# Patient Record
Sex: Female | Born: 1965 | Race: White | Hispanic: No | Marital: Married | Smoking: Never smoker
Health system: Southern US, Community
[De-identification: ages and names within clinical notes are randomized; demographics above are authoritative.]

## PROBLEM LIST (undated history)

## (undated) DIAGNOSIS — Z8619 Personal history of other infectious and parasitic diseases: Secondary | ICD-10-CM

## (undated) DIAGNOSIS — K5900 Constipation, unspecified: Secondary | ICD-10-CM

## (undated) DIAGNOSIS — T7840XA Allergy, unspecified, initial encounter: Secondary | ICD-10-CM

## (undated) DIAGNOSIS — I10 Essential (primary) hypertension: Secondary | ICD-10-CM

## (undated) DIAGNOSIS — N882 Stricture and stenosis of cervix uteri: Secondary | ICD-10-CM

## (undated) DIAGNOSIS — K219 Gastro-esophageal reflux disease without esophagitis: Secondary | ICD-10-CM

## (undated) DIAGNOSIS — N857 Hematometra: Secondary | ICD-10-CM

## (undated) DIAGNOSIS — Z8679 Personal history of other diseases of the circulatory system: Secondary | ICD-10-CM

## (undated) DIAGNOSIS — Z8489 Family history of other specified conditions: Secondary | ICD-10-CM

## (undated) DIAGNOSIS — Z973 Presence of spectacles and contact lenses: Secondary | ICD-10-CM

## (undated) HISTORY — DX: Gastro-esophageal reflux disease without esophagitis: K21.9

## (undated) HISTORY — PX: TOE SURGERY: SHX1073

## (undated) HISTORY — DX: Allergy, unspecified, initial encounter: T78.40XA

## (undated) HISTORY — PX: LASIK: SHX215

## (undated) HISTORY — DX: Essential (primary) hypertension: I10

## (undated) HISTORY — PX: OTHER SURGICAL HISTORY: SHX169

---

## 2005-02-17 ENCOUNTER — Inpatient Hospital Stay (HOSPITAL_COMMUNITY): Admission: RE | Admit: 2005-02-17 | Discharge: 2005-02-20 | Payer: Self-pay | Admitting: Obstetrics and Gynecology

## 2005-03-30 ENCOUNTER — Other Ambulatory Visit: Admission: RE | Admit: 2005-03-30 | Discharge: 2005-03-30 | Payer: Self-pay | Admitting: Obstetrics and Gynecology

## 2006-08-21 ENCOUNTER — Ambulatory Visit: Payer: Self-pay | Admitting: Family Medicine

## 2007-04-16 ENCOUNTER — Ambulatory Visit: Payer: Self-pay | Admitting: Family Medicine

## 2007-04-16 DIAGNOSIS — J309 Allergic rhinitis, unspecified: Secondary | ICD-10-CM | POA: Insufficient documentation

## 2007-04-16 DIAGNOSIS — I10 Essential (primary) hypertension: Secondary | ICD-10-CM | POA: Insufficient documentation

## 2007-04-16 DIAGNOSIS — K219 Gastro-esophageal reflux disease without esophagitis: Secondary | ICD-10-CM

## 2008-02-04 ENCOUNTER — Ambulatory Visit: Payer: Self-pay | Admitting: Family Medicine

## 2008-02-04 DIAGNOSIS — J019 Acute sinusitis, unspecified: Secondary | ICD-10-CM

## 2008-08-01 ENCOUNTER — Ambulatory Visit: Payer: Self-pay | Admitting: Family Medicine

## 2008-08-11 ENCOUNTER — Encounter: Admission: RE | Admit: 2008-08-11 | Discharge: 2008-08-11 | Payer: Self-pay | Admitting: Obstetrics and Gynecology

## 2008-09-01 ENCOUNTER — Ambulatory Visit: Payer: Self-pay | Admitting: Family Medicine

## 2008-09-04 ENCOUNTER — Encounter: Admission: RE | Admit: 2008-09-04 | Discharge: 2008-09-04 | Payer: Self-pay | Admitting: General Surgery

## 2009-06-04 ENCOUNTER — Ambulatory Visit: Payer: Self-pay | Admitting: Family Medicine

## 2009-06-04 DIAGNOSIS — H01139 Eczematous dermatitis of unspecified eye, unspecified eyelid: Secondary | ICD-10-CM | POA: Insufficient documentation

## 2009-09-29 ENCOUNTER — Ambulatory Visit: Payer: Self-pay | Admitting: Family Medicine

## 2009-12-15 HISTORY — PX: HYSTEROSCOPY W/ ENDOMETRIAL ABLATION: SUR665

## 2009-12-15 HISTORY — PX: DILATION AND CURETTAGE OF UTERUS: SHX78

## 2010-04-06 IMAGING — MG MM DIAGNOSTIC UNILATERAL L
3 series · 3 of 3 positions shown · non-contrast
Comparison: 06/08/2006

CLINICAL DATA: Abnormal left screening mammogram

DIGITAL DIAGNOSTIC  LEFT  MAMMOGRAM  ] AND LEFT BREAST ULTRASOUND:

[L CC (1 of 2)]
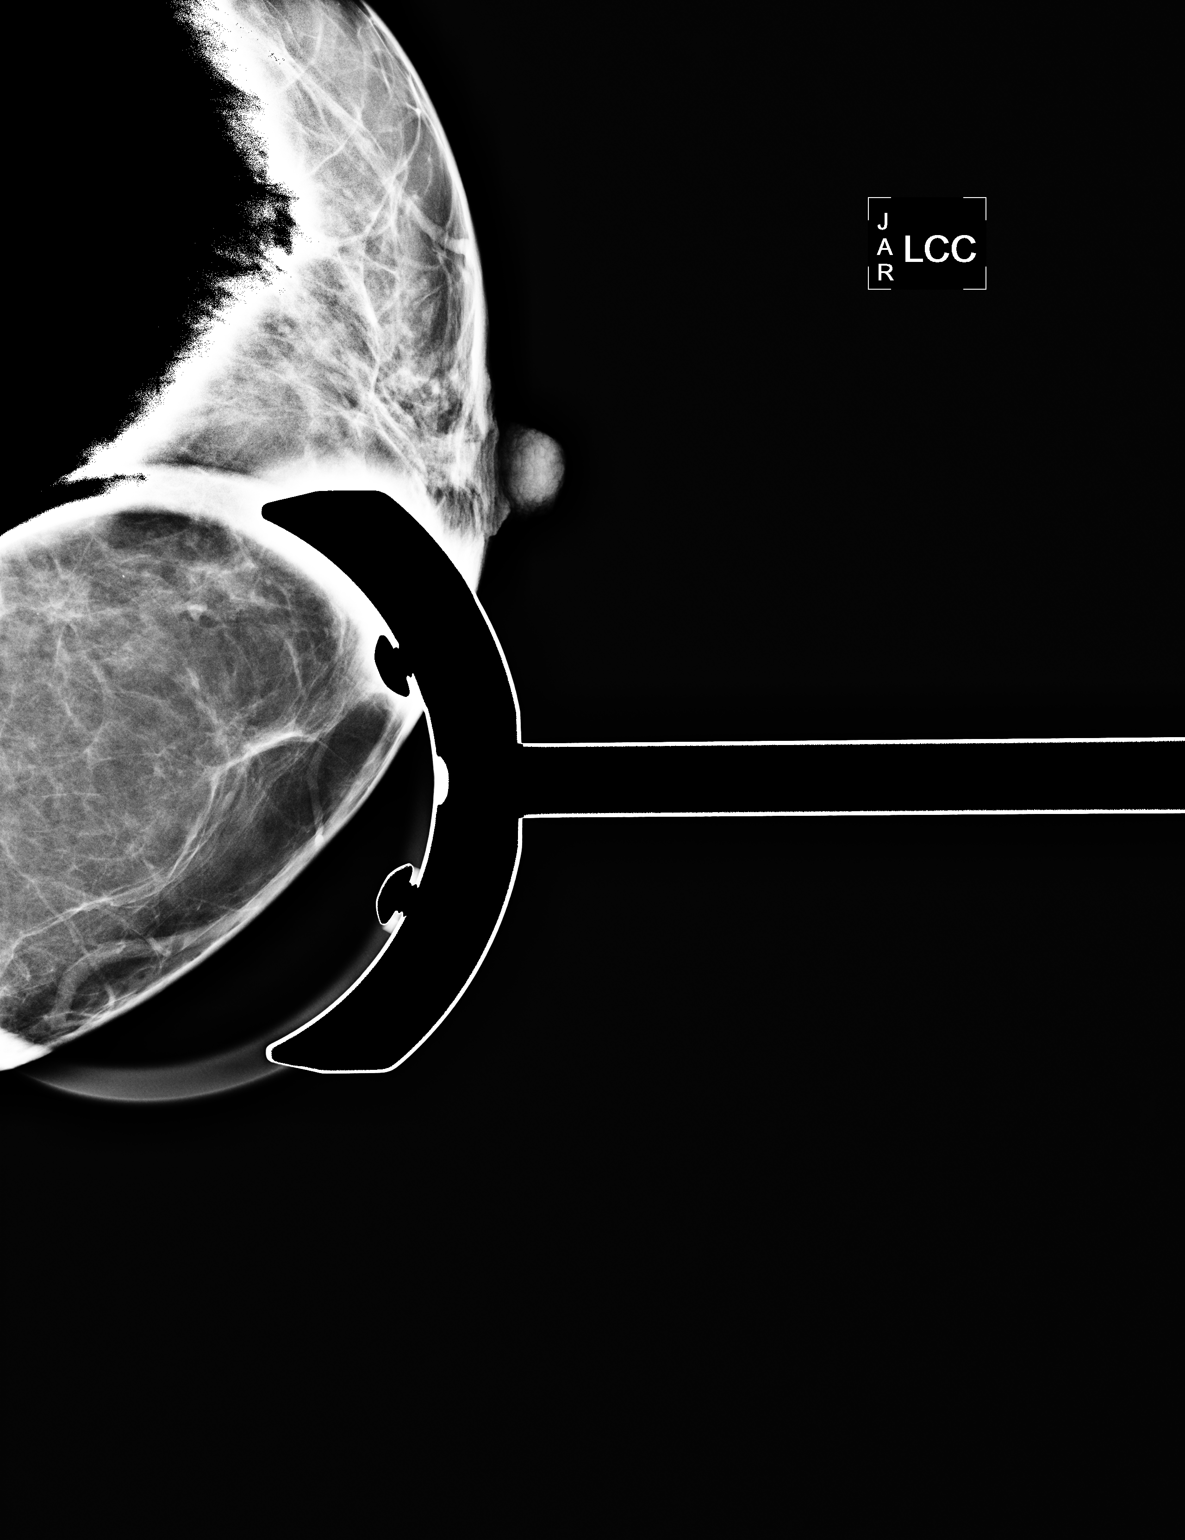

[L MLO]
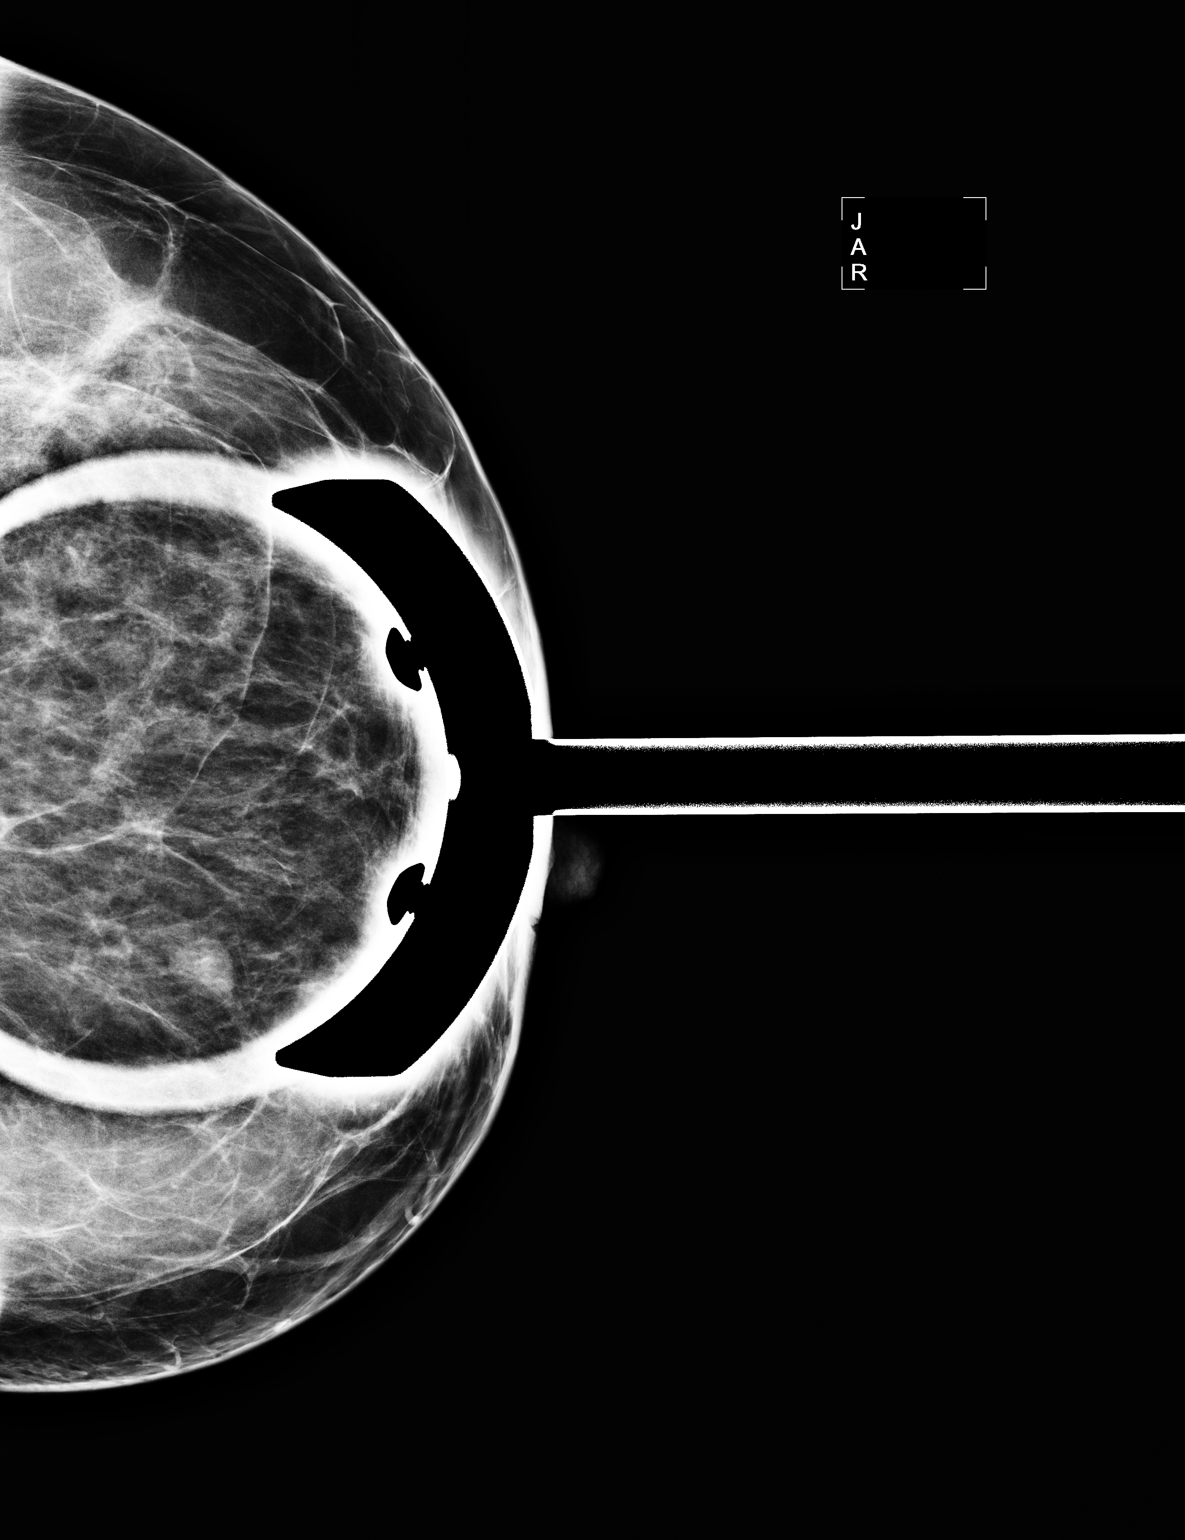

[L CC (2 of 2)]
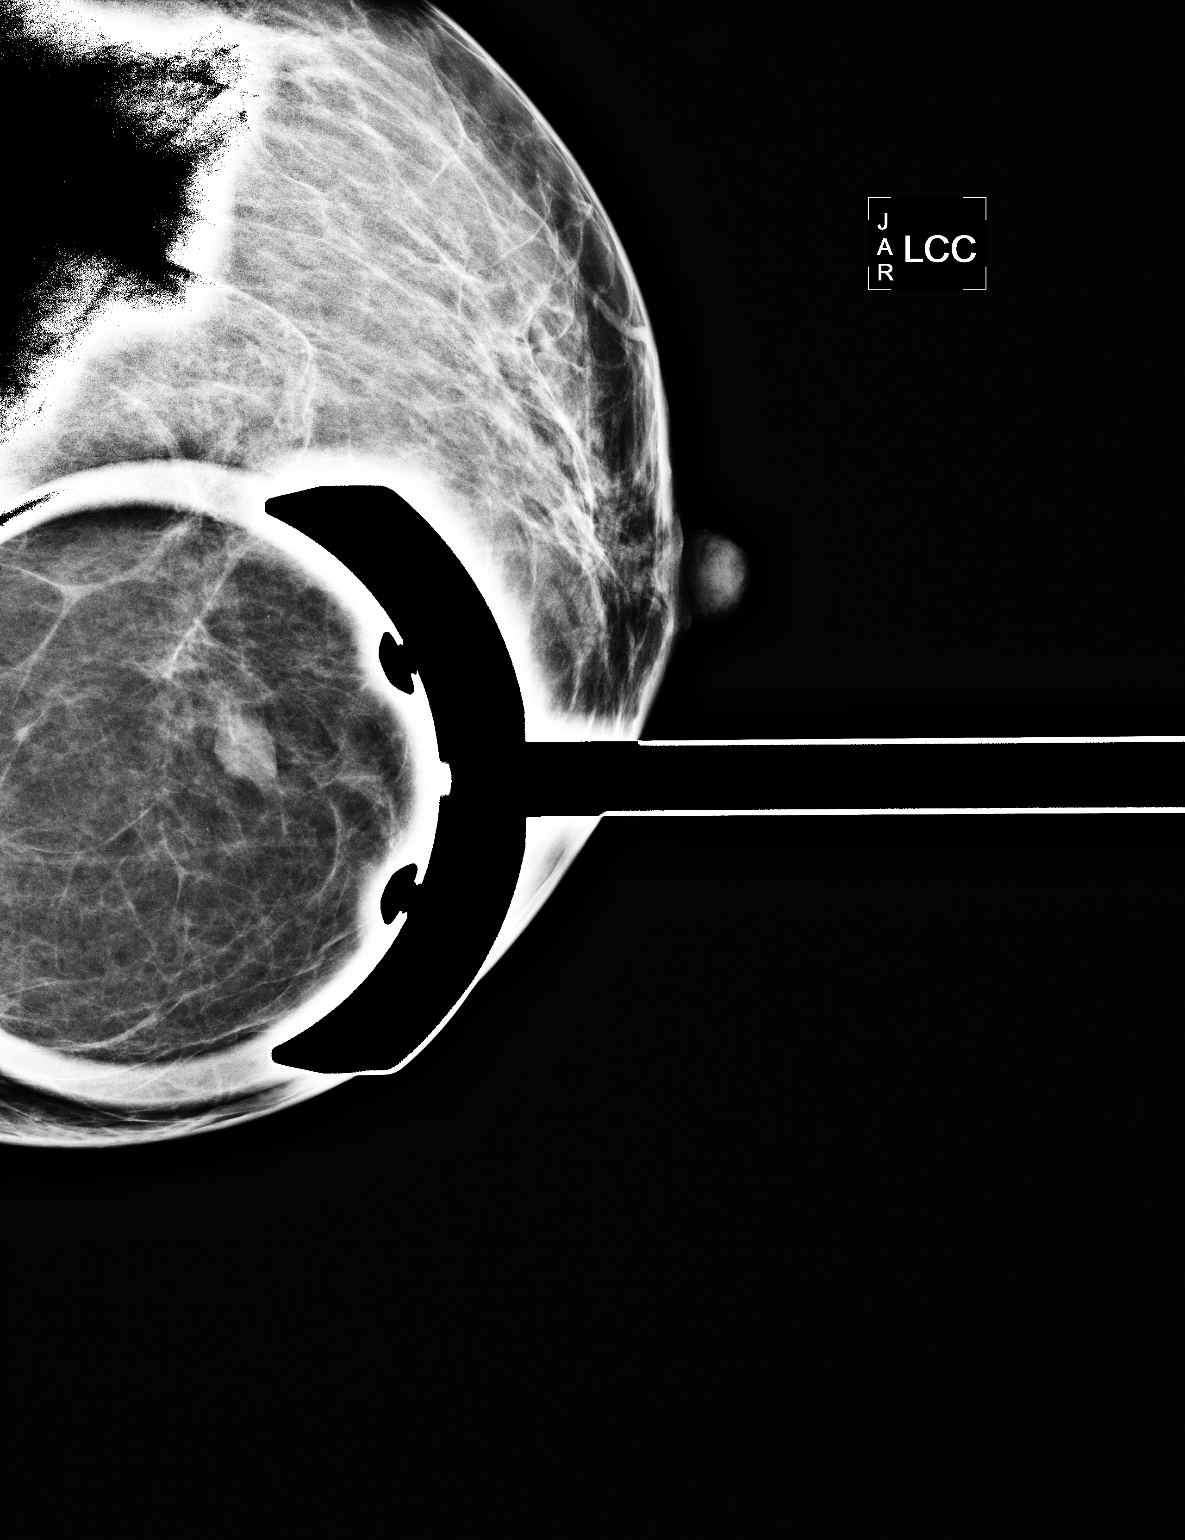

[3 of 3 positions shown; findings below may reference images not displayed]

FINDINGS: Spot compression views of the medial aspect of left
breast was performed.  There is persistence of a low density
nodule.  It has a stable appearance when compared to the 06/08/2006
study.  It is well circumscribed with no malignant-type
microcalcifications.

On physical exam, I do not palpate a mass in the left breast.

Ultrasound is performed, showing there is a hypoechoic well
circumscribed nodule with increased through transmission in the
left breast at 10 o'clock approximately 4 cm from the nipple
measuring 8 x 4 x 9 mm. This is consistent with a fibroadenoma
given its stability since the 06/07/2006 study.
IMPRESSION: Benign appearing mass, left breast.  Screening mammogram in 1 year
is recommended.

BI-RADS CATEGORY 2:  Benign finding(s).

## 2010-04-06 IMAGING — US UNKNOWN PR STUDY
1 series · 5 of 5 positions shown · non-contrast
Comparison: 06/08/2006

CLINICAL DATA: Abnormal left screening mammogram

DIGITAL DIAGNOSTIC  LEFT  MAMMOGRAM  ] AND LEFT BREAST ULTRASOUND:

[Series 1: unknown pr study · 5 of 5 slices shown]
[im 1/5]
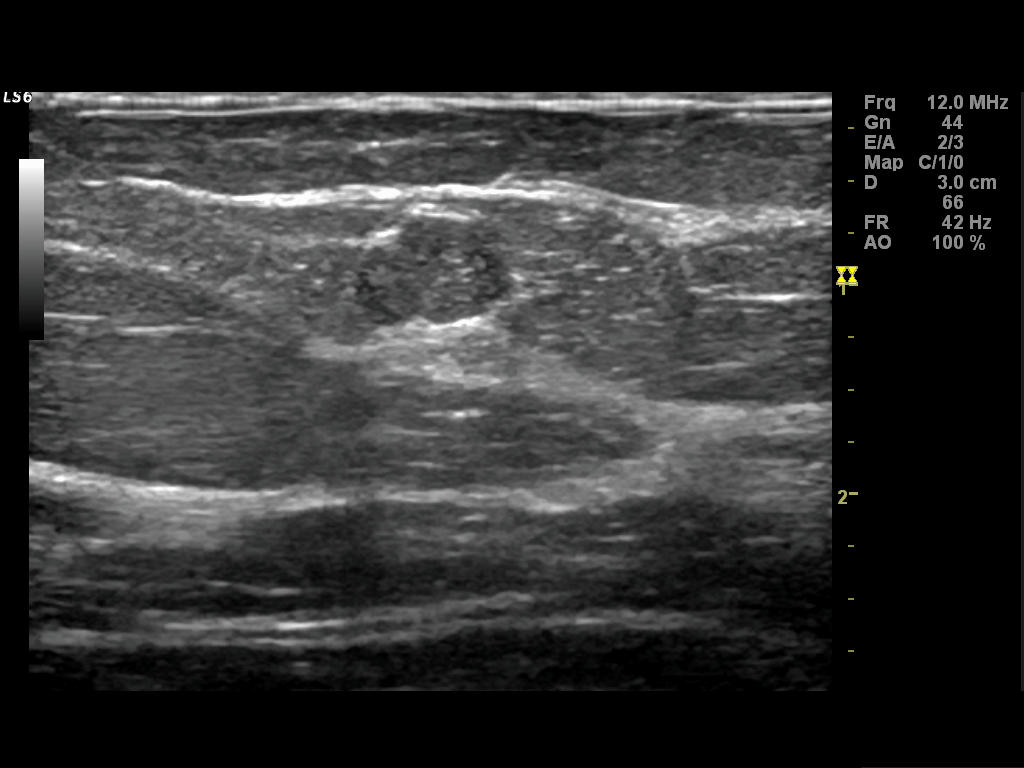
[im 2/5]
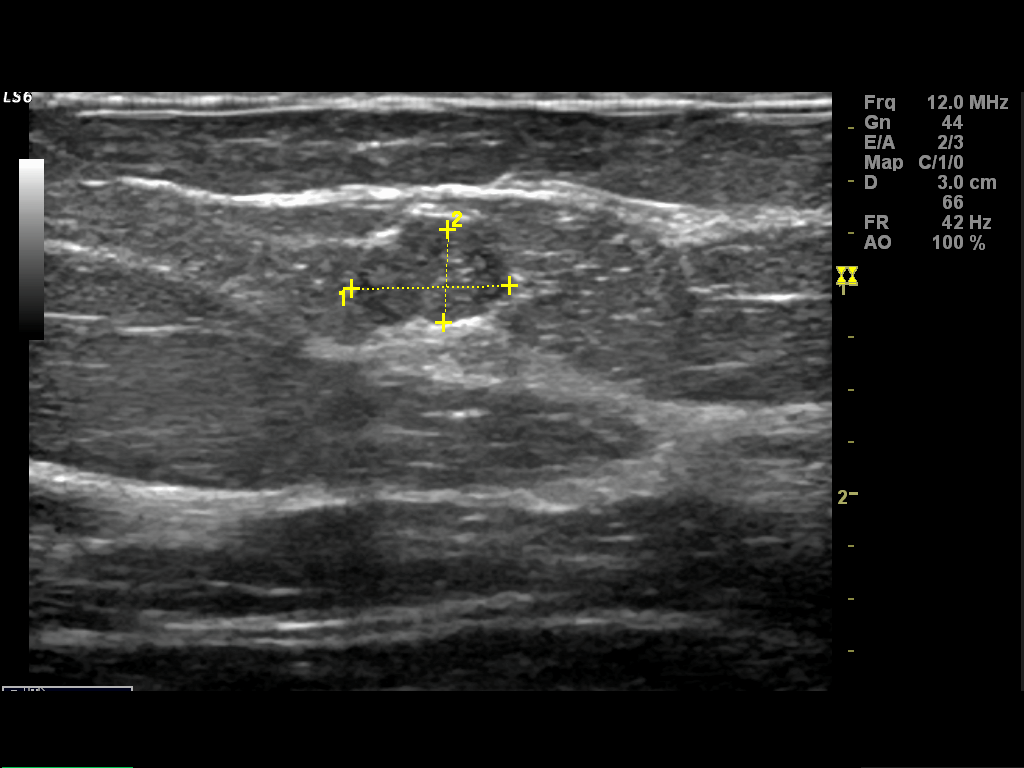
[im 3/5]
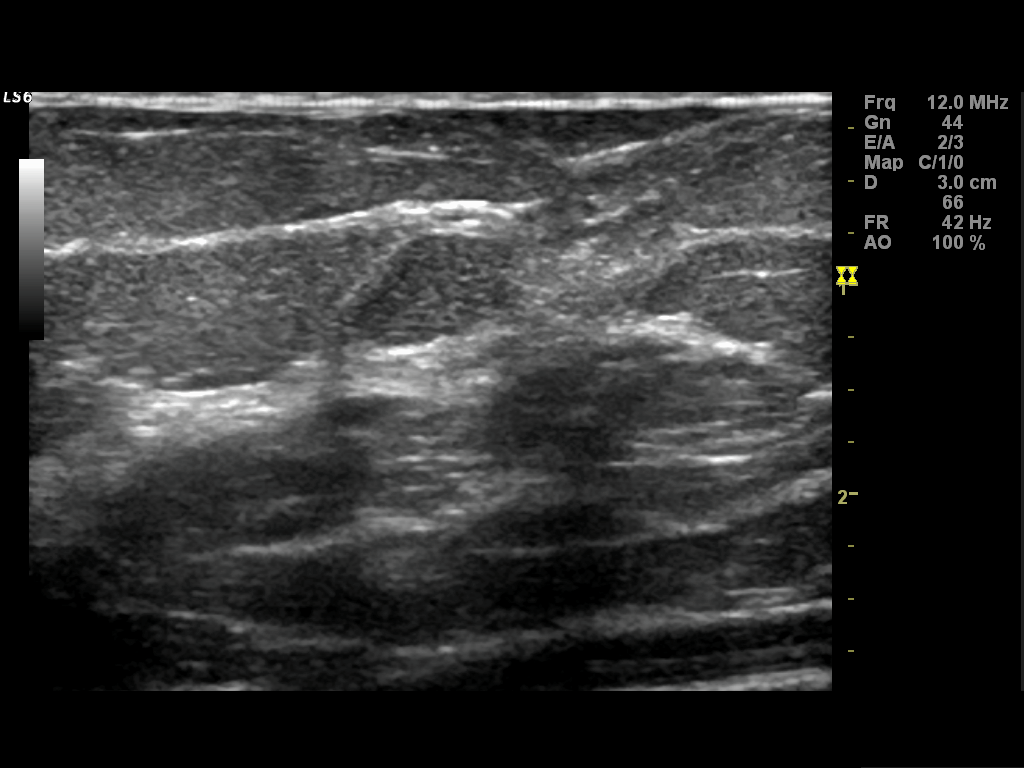
[im 4/5]
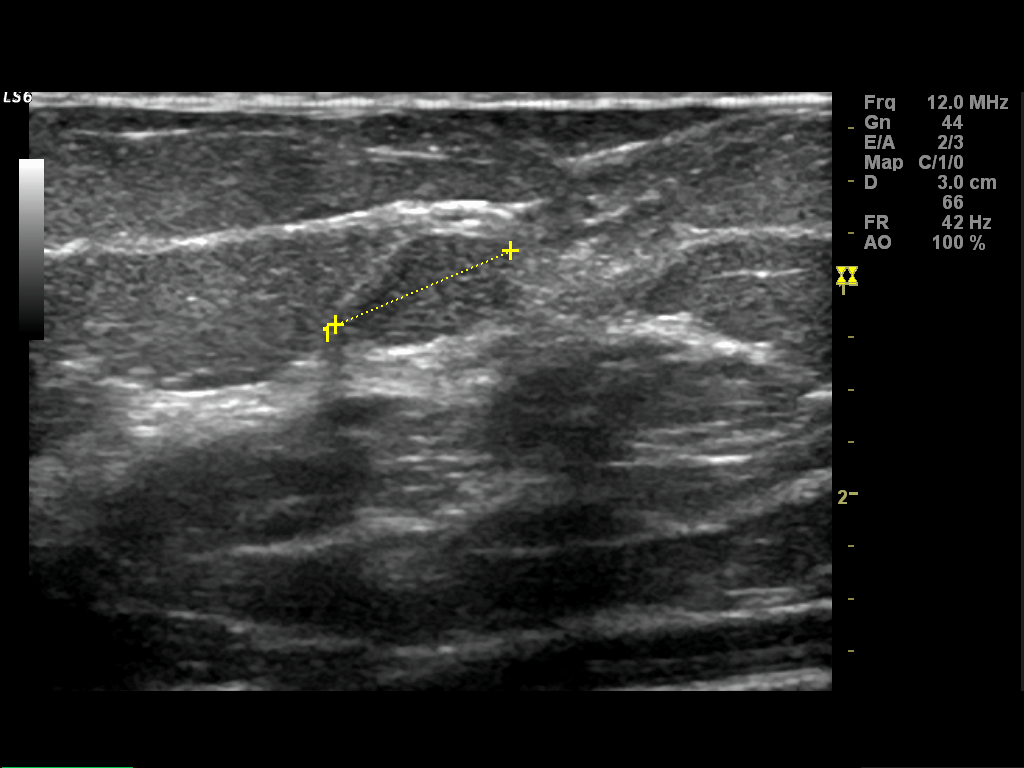
[im 5/5]
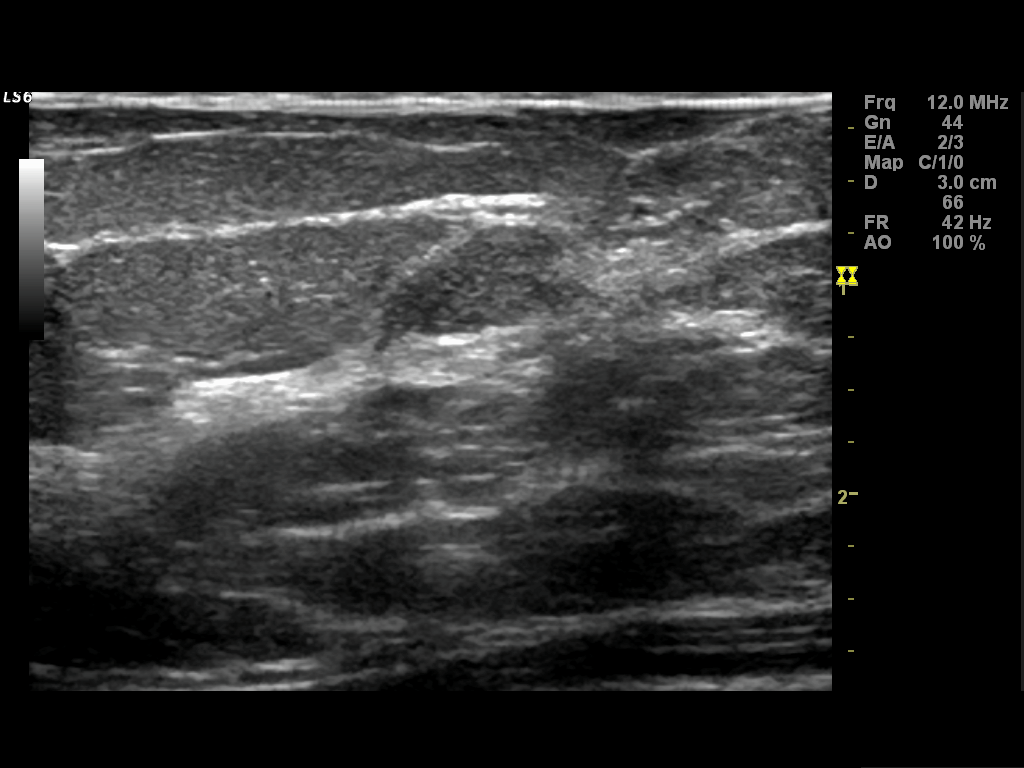

[5 of 5 positions shown; findings below may reference images not displayed]

FINDINGS: Spot compression views of the medial aspect of left
breast was performed.  There is persistence of a low density
nodule.  It has a stable appearance when compared to the 06/08/2006
study.  It is well circumscribed with no malignant-type
microcalcifications.

On physical exam, I do not palpate a mass in the left breast.

Ultrasound is performed, showing there is a hypoechoic well
circumscribed nodule with increased through transmission in the
left breast at 10 o'clock approximately 4 cm from the nipple
measuring 8 x 4 x 9 mm. This is consistent with a fibroadenoma
given its stability since the 06/07/2006 study.
IMPRESSION: Benign appearing mass, left breast.  Screening mammogram in 1 year
is recommended.

BI-RADS CATEGORY 2:  Benign finding(s).

## 2010-05-18 ENCOUNTER — Ambulatory Visit: Payer: Self-pay | Admitting: Family Medicine

## 2010-05-18 LAB — CONVERTED CEMR LAB
Bilirubin Urine: NEGATIVE
Blood in Urine, dipstick: NEGATIVE
Glucose, Urine, Semiquant: NEGATIVE
Ketones, urine, test strip: NEGATIVE
Specific Gravity, Urine: 1.015
Urobilinogen, UA: 0.2
pH: 8.5

## 2010-05-19 LAB — CONVERTED CEMR LAB
Bilirubin, Direct: 0.2 mg/dL (ref 0.0–0.3)
CO2: 27 meq/L (ref 19–32)
Calcium: 8.8 mg/dL (ref 8.4–10.5)
Eosinophils Relative: 1.4 % (ref 0.0–5.0)
Glucose, Bld: 78 mg/dL (ref 70–99)
HDL: 62.7 mg/dL (ref >39.00)
Hemoglobin: 13.1 g/dL (ref 12.0–15.0)
LDL Cholesterol: 112 mg/dL — ABNORMAL HIGH (ref 0–99)
Lymphocytes Relative: 36.6 % (ref 12.0–46.0)
Lymphs Abs: 2.3 10*3/uL (ref 0.7–4.0)
Monocytes Relative: 9.8 % (ref 3.0–12.0)
Neutro Abs: 3.1 10*3/uL (ref 1.4–7.7)
Neutrophils Relative %: 51.1 % (ref 43.0–77.0)
Platelets: 315 10*3/uL (ref 150.0–400.0)
RDW: 13.9 % (ref 11.5–14.6)
Sodium: 142 meq/L (ref 135–145)
TSH: 1.21 microintl units/mL (ref 0.35–5.50)
Total Bilirubin: 0.8 mg/dL (ref 0.3–1.2)
Total CHOL/HDL Ratio: 3
Triglycerides: 74 mg/dL (ref 0.0–149.0)
VLDL: 14.8 mg/dL (ref 0.0–40.0)

## 2010-05-25 ENCOUNTER — Ambulatory Visit: Payer: Self-pay | Admitting: Family Medicine

## 2010-08-23 ENCOUNTER — Ambulatory Visit: Payer: Self-pay | Admitting: Family Medicine

## 2010-11-10 ENCOUNTER — Ambulatory Visit
Admission: RE | Admit: 2010-11-10 | Discharge: 2010-11-10 | Payer: Self-pay | Source: Home / Self Care | Attending: Family Medicine | Admitting: Family Medicine

## 2010-11-12 ENCOUNTER — Ambulatory Visit
Admission: RE | Admit: 2010-11-12 | Discharge: 2010-11-12 | Payer: Self-pay | Source: Home / Self Care | Attending: Family Medicine | Admitting: Family Medicine

## 2010-11-12 ENCOUNTER — Encounter: Payer: Self-pay | Admitting: Family Medicine

## 2010-11-16 NOTE — Assessment & Plan Note (Signed)
Summary: CPX/NO PAP/NJR   Vital Signs:  Patient profile:   45 year old Dana [name] Height:      63 inches Weight:      181 pounds BMI:     32.18 BP sitting:   134 / 90  (left arm) Cuff size:   regular  Vitals Entered By: Raechel Ache, RN (May 25, 2010 2:41 PM) CC: CPX, labs done. Sees gyn.   History of Present Illness: 45 yr old Dana [name] for a cpx. She feels well in general, but her allergies are acting up again this summer, and she has had some flares of her facial acne. She has used Duac with success in the past. She checks her BP at home, and it always runs normal. She often gets white coat HTN at the  doctor's office.   Allergies: No Known Drug Allergies  Past History:  Past Medical History: Allergic rhinitis GERD Hypertension sees Dr. Candice Camp for GYN exams  Past Surgical History: Lasik Bone spur removed from toe D and C for benign polyp, 12-2009 per Dr. Rana Snare  Family History: Reviewed history from 06/01/2007 and no changes required. Family History High cholesterol Family History Hypertension  Social History: Reviewed history from 06/01/2007 and no changes required. Never Smoked Alcohol use-yes Drug use-no Regular exercise-no  Review of Systems  The patient denies anorexia, fever, weight loss, weight gain, vision loss, decreased hearing, hoarseness, chest pain, syncope, dyspnea on exertion, peripheral edema, prolonged cough, headaches, hemoptysis, abdominal pain, melena, hematochezia, severe indigestion/heartburn, hematuria, incontinence, genital sores, muscle weakness, suspicious skin lesions, transient blindness, difficulty walking, depression, unusual weight change, abnormal bleeding, enlarged lymph nodes, angioedema, breast masses, and testicular masses.    Physical Exam  General:  overweight-appearing.   Head:  Normocephalic and atraumatic without obvious abnormalities. No apparent alopecia or balding. Eyes:  No corneal or conjunctival inflammation  noted. EOMI. Perrla. Funduscopic exam benign, without hemorrhages, exudates or papilledema. Vision grossly normal. Ears:  External ear exam shows no significant lesions or deformities.  Otoscopic examination reveals clear canals, tympanic membranes are intact bilaterally without bulging, retraction, inflammation or discharge. Hearing is grossly normal bilaterally. Nose:  External nasal examination shows no deformity or inflammation. Nasal mucosa are pink and moist without lesions or exudates. Mouth:  Oral mucosa and oropharynx without lesions or exudates.  Teeth in good repair. Neck:  No deformities, masses, or tenderness noted. Chest Markeith Jue:  No deformities, masses, or tenderness noted. Lungs:  Normal respiratory effort, chest expands symmetrically. Lungs are clear to auscultation, no crackles or wheezes. Heart:  Normal rate and regular rhythm. S1 and S2 normal without gallop, murmur, click, rub or other extra sounds. Abdomen:  Bowel sounds positive,abdomen soft and non-tender without masses, organomegaly or hernias noted. Msk:  No deformity or scoliosis noted of thoracic or lumbar spine.   Pulses:  R and L carotid,radial,femoral,dorsalis pedis and posterior tibial pulses are full and equal bilaterally Extremities:  No clubbing, cyanosis, edema, or deformity noted with normal full range of motion of all joints.   Neurologic:  No cranial nerve deficits noted. Station and gait are normal. Plantar reflexes are down-going bilaterally. DTRs are symmetrical throughout. Sensory, motor and coordinative functions appear intact. Skin:  Intact without suspicious lesions or rashes Cervical Nodes:  No lymphadenopathy noted Axillary Nodes:  No palpable lymphadenopathy Inguinal Nodes:  No significant adenopathy Psych:  Cognition and judgment appear intact. Alert and cooperative with normal attention span and concentration. No apparent delusions, illusions, hallucinations   Impression &  Recommendations:  Problem # 1:  hHEALTH MAINTENANCE EXAM (ICD-V70.0)  Complete Medication List: 1)  Zyrtec Hives Relief 10 Mg Tabs (Cetirizine hcl) .... Take 1 tablet by mouth once a day 2)  Patanol 0.1 % Soln (Olopatadine hcl) .... 2 drops in eyes every 4 hours as needed 3)  Singulair 10 Mg Tabs (Montelukast sodium) .... Once daily 4)  Nasonex 50 Mcg/act Susp (Mometasone furoate) .... As needed 5)  Duac Cs 1-5 % Kit (Clindamycin-benzoyl per-cleans) .... Apply two times a day as needed  Patient Instructions: 1)  It is important that you exercise reguarly at least 20 minutes 5 times a week. If you develop chest pain, have severe difficulty breathing, or feel very tired, stop exercising immediately and seek medical attention.  2)  You need to lose weight. Consider a lower calorie diet and regular exercise.  3)  Refilled Duac to use as needed .  4)  She will monitor her BP at home Prescriptions: DUAC CS 1-5 % KIT (CLINDAMYCIN-BENZOYL PER-CLEANS) apply two times a day as needed  #30 x 11   Entered and Authorized by:   Nelwyn Salisbury MD   Signed by:   Nelwyn Salisbury MD on 05/25/2010   Method used:   Electronically to        [facility] # [id number]* (retail)       [address]       [place], [place]  [address]       Ph: [phone]       Fax: [phone]   RxID:   [id number] SINGULAIR 10 MG  TABS (MONTELUKAST SODIUM) once daily  #90 x 3   Entered and Authorized by:   Nelwyn Salisbury MD   Signed by:   Nelwyn Salisbury MD on 05/25/2010   Method used:   Print then Mail to Patient   RxID:   [id number]

## 2010-11-16 NOTE — Assessment & Plan Note (Signed)
Summary: SINUSITIS? // RS   Vital Signs:  Patient profile:   45 year old female Weight:      179 pounds O2 Sat:      97 % Temp:     97.8 degrees F Pulse rate:   111 / minute BP sitting:   120 / 84  (left arm)  Vitals Entered By: Pura Spice, RN (August 23, 2010 3:14 PM) CC: sinus cough hoarse sx's since Sat   History of Present Illness: Here for 3 days of sinus pressure, PND, HA, ST, and a dry cough. No fever. Using Sudafed.   Allergies (verified): No Known Drug Allergies  Past History:  Past Medical History: Reviewed history from 05/25/2010 and no changes required. Allergic rhinitis GERD Hypertension sees Dr. Candice Camp for GYN exams  Review of Systems  The patient denies anorexia, fever, weight loss, weight gain, vision loss, decreased hearing, hoarseness, chest pain, syncope, dyspnea on exertion, peripheral edema, hemoptysis, abdominal pain, melena, hematochezia, severe indigestion/heartburn, hematuria, incontinence, genital sores, muscle weakness, suspicious skin lesions, transient blindness, difficulty walking, depression, unusual weight change, abnormal bleeding, enlarged lymph nodes, angioedema, breast masses, and testicular masses.    Physical Exam  General:  Well-developed,well-nourished,in no acute distress; alert,appropriate and cooperative throughout examination Head:  Normocephalic and atraumatic without obvious abnormalities. No apparent alopecia or balding. Eyes:  No corneal or conjunctival inflammation noted. EOMI. Perrla. Funduscopic exam benign, without hemorrhages, exudates or papilledema. Vision grossly normal. Ears:  External ear exam shows no significant lesions or deformities.  Otoscopic examination reveals clear canals, tympanic membranes are intact bilaterally without bulging, retraction, inflammation or discharge. Hearing is grossly normal bilaterally. Nose:  External nasal examination shows no deformity or inflammation. Nasal mucosa are pink and  moist without lesions or exudates. Mouth:  Oral mucosa and oropharynx without lesions or exudates.  Teeth in good repair. Neck:  No deformities, masses, or tenderness noted. Lungs:  Normal respiratory effort, chest expands symmetrically. Lungs are clear to auscultation, no crackles or wheezes. Voice is hoarse    Impression & Recommendations:  Problem # 1:  ACUTE SINUSITIS, UNSPECIFIED (ICD-461.9)  Her updated medication list for this problem includes:    Nasonex 50 Mcg/act Susp (Mometasone furoate) .Marland Kitchen... As needed    Zithromax Z-pak 250 Mg Tabs (Azithromycin) .Marland Kitchen... As directed  Complete Medication List: 1)  Zyrtec Hives Relief 10 Mg Tabs (Cetirizine hcl) .... Take 1 tablet by mouth once a day 2)  Patanol 0.1 % Soln (Olopatadine hcl) .... 2 drops in eyes every 4 hours as needed 3)  Singulair 10 Mg Tabs (Montelukast sodium) .... Once daily 4)  Nasonex 50 Mcg/act Susp (Mometasone furoate) .... As needed 5)  Duac Cs 1-5 % Kit (Clindamycin-benzoyl per-cleans) .... Apply two times a day as needed 6)  Zithromax Z-pak 250 Mg Tabs (Azithromycin) .... As directed  Patient Instructions: 1)  Please schedule a follow-up appointment as needed .  Prescriptions: ZITHROMAX Z-PAK 250 MG TABS (AZITHROMYCIN) as directed  #1 x 0   Entered and Authorized by:   Nelwyn Salisbury MD   Signed by:   Nelwyn Salisbury MD on 08/23/2010   Method used:   Electronically to        [facility] # [address] (retail)       [address]       [place], [place]  [address]       Ph: [phone]       Fax: [phone]   RxID:  [id number]    Orders Added: 1)  Est. Patient Level IV [27253]

## 2010-11-18 NOTE — Assessment & Plan Note (Signed)
Summary: TB TEST // RS  Nurse Visit   Allergies: No Known Drug Allergies  Immunizations Administered:  PPD Skin Test:    Vaccine Type: PPD    Site: left forearm    Mfr: Sanofi Pasteur    Dose: 0.1 ml    Route: ID    Given by: Pura Spice, RN    Exp. Date: 06/17/2012    Lot #: [id number]  Orders Added: 1)  TB Skin Test [phone]

## 2010-11-18 NOTE — Letter (Signed)
Summary: Immunization/Shot Record  [place] [facility]  [address] [place], [place] [address]   Phone: [phone]  Fax: [phone]     Immunization Record for: [facility]  Vaccine 1 2 3 4 5 6  HepB Hepatitis B                    DTP Diphtheria, Tetanus, Pertussis                         HIB Haemophilus influenzae Type b                 DDUKGURKYH IPV Inactivated Poliovirus             MMR Measles, Mumps, Jeanella Craze CWCBJSEGBT DVVOHYWVPX Varicella Varivax    TGGYIRSWNI OEVOJJKKXF GHWEXHBZJI Pneumococcal           Hep A Hepatitis A   RCVELFYBOF BPZWCHENID POEUMPNTIR WERXVQMGQQ        Tetanus Booster Date of Last:    Flu Shot Date of Last:  Pneumovax Date of Last:  Meningococcal Vaccine Given:       Other Vaccines HPV Vaccine/ Date of Last:    Vaccine/ Date of Last:    Vaccine/ Date of Last:     PYPPJKDTOI  ZTIWPYKDXI  PJASNKNLZJ Rotavirus Vaccine/ Date of Last:    Vaccine/ Date of Last:    Vaccine/ Date of Last:     QBHALPFXTK  [facility]  WIOXBDZHGD Zostavax Vaccine/ Date of Last:     Marguerite Olea  JMEQASTMHD  Marguerite Olea  QQIWLNLGXQ  JJHERDEYCX  Recommended Childhood and Adolescent Immunization Schedule [place]  2006 Vaccine Age Birth 1 mos 2 mos 4 mos 6 mos 12 mos 15 mos 18 mos 24 mos 4-6 yrs 11-12 yrs 13-14 yrs 15 yrs 16-18 yrs Hepatitis B1 HepB HepB HepB1  HepB  HepB Series Catch-Up Diphtheria, Tetanus, Pertussis2   DTaP DTaP DTaP   DTaP  DTaP Tdap  Tdap Catch-Up Haemophilus influenzae type b3   Hib Hib Hib3 Hib        Inactivated Poliovirus   IPV IPV  IPV   IPV     Measles, Mumps, Rubella4      MMR   MMR M MR MMR Catch-Up Varicella5       Varicella  Varicella Catch-Up Meningo-coccal6           MCV4  MCV4  CatchUpV4           MPSV4 for High Risk Groups  C MCV4 for High Risk Groups Pneumo-coccal7   PCV PCV PCV PCV   PCV  Catch-Up PPV for High Risk Groups         PPV for High Risk Groups  Influenza8      Influenza (Yearly)  Influenza (Yearly) for High Risk Groups Hepatitis A9       HepA Series  This schedule indicates the recommended ages for routine administration of currently licensed childhood vaccines, as of September 16, 2004, for children through age 10 years. Any dose not administered at the recommended age should be administered at any subsequent visit when indicated and feasible. Indicates age groups that warrant special effort to administer those vaccines not previously administered. Additional vaccines may be licensed and recommended during the year. Licensed combination vaccines may be used whenever any components of the combination are indicated and other components of the vaccine are not contraindicated and if approved by the Food and Drug Administration  for that dose of the series. Providers should consult the respective ACIP statement for detailed recommendations. Clinically significant adverse events that follow immunization should be reported to the Vaccine Adverse Event Reporting System (VAERS). Guidance about how to obtain and complete a VAERS form is available at www.vaers.LAgents.no or by telephone, 281-687-6868.  The Childhood and Adolescent Immunization Schedule is approved by: Advisory Committee on Administrator http://www.wade.com/   American Academy of Pediatrics BridgeDigest.com.cy   American Academy [employer] American.aafp.org

## 2010-11-18 NOTE — Assessment & Plan Note (Signed)
Summary: TB READING // RS  Nurse Visit   Allergies: No Known Drug Allergies  PPD Results    Date of reading: 11/12/2010    Results: < 5mm    Interpretation: negative read by gina hudy rn

## 2011-03-04 NOTE — H&P (Signed)
Dana [name], Dana [name]                ACCOUNT NO.:  [id number]   MEDICAL RECORD NO.:  [id number]          PATIENT TYPE:  INP   LOCATION:  NA                            [facility]   PHYSICIAN:  Dineen Kid. Rana Snare, M.D.    DATE OF BIRTH:  1966/04/17   DATE OF ADMISSION:  DATE OF DISCHARGE:                                HISTORY & PHYSICAL   HISTORY OF PRESENT ILLNESS:  Dana [name] is a 45 year old G3 P1 A1 who  presents for repeat cesarean section. Her pregnancy began in [place]  where she transferred to our care at [redacted] weeks gestational age. Her pregnancy  has been complicated by chronic hypertension where she had been on  antihypertensive early in the pregnancy including Aldomet, had discontinued  that near the last trimester, and has not had any problems with blood  pressure in the last trimester and is currently on no medications. 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 Her estimated date of confinement  is Feb 27, 2005. Her advanced maternal age was evaluated by ultrasounds and  she declined amniocentesis and AFP evaluation. She does have a history of  Jewish lineage and was offered Tay-Sachs testing and declined. She does have  a history of GBS positive in a previous pregnancy but desires repeat  cesarean section.   PAST MEDICAL HISTORY:  Again, history of chronic hypertension, currently on  no medications. History of HSV. History of toe surgery. History of LASIK eye  surgery. Currently on prenatal vitamins. She has no known drug allergies.   PHYSICAL EXAMINATION:  VITAL SIGNS:  Blood pressure 108/58, weight 192.  HEART:  Regular rate and rhythm.  LUNGS:  Clear to auscultation bilaterally.  ABDOMEN:  Gravid, nontender. Fundal height 38 cm.  PELVIC:  The cervix is closed, thick, and high.   IMPRESSION AND PLAN:  1.  Intrauterine pregnancy at [redacted] weeks gestational age.  2.  Previous cesarean  section, desires repeat.   Plan repeat low segment transverse cesarean section. The risks and benefits  were discussed at length which include but are not limited to risk of  infection; bleeding; damage to uterus, tubes, ovaries, bowel, bladder fetus;  risks associated with anesthesia and blood transfusion. She does give her  informed consent and wish to proceed.      DCL/MEDQ  D:  02/17/2005  T:  02/17/2005  Job:  [id number]

## 2011-03-04 NOTE — Discharge Summary (Signed)
Dana [name], Dana [name]                ACCOUNT NO.:  [id number]   MEDICAL RECORD NO.:  [id number]          PATIENT TYPE:  INP   LOCATION:  9140                          [facility]   PHYSICIAN:  Juluis Mire, M.D.   DATE OF BIRTH:  1966-04-21   DATE OF ADMISSION:  02/17/2005  DATE OF DISCHARGE:  02/20/2005                                 DISCHARGE SUMMARY   ADMITTING DIAGNOSIS:  1. Intrauterine pregnancy at term.  2. Previous cesarean section, desires repeat.     DISCHARGE DIAGNOSIS:  1. Status post low transverse cesarean section.  2. Viable female infant.     PROCEDURE:  Repeat low transverse cesarean section.   REASON FOR ADMISSION:  Please see dictated H&P.   HOSPITAL COURSE:  The patient is a 45 year old married female gravida 3,  para 1 that presents to [facility] for scheduled cesarean  section. The patient had had a repeat cesarean previously and desired  repeat. On the morning of admission the patient was taken to the operating  room where spinal anesthesia was administered without difficulty. Low  transverse incision was made with delivery of a viable female infant weighing  7 pounds 4 ounces with Apgars of 9 at one minute and 9 at 5 minutes.  Arterial cord pH was 7.33. The patient tolerated procedure well and was  taken to the recovery room in stable condition. On postoperative day #1, the  patient was without complaint. Vital signs stable. She was afebrile. Fundus  was firm and nontender. Abdominal dressing was noted to be clean, dry and  intact. Foley had been discontinued. The patient was voiding adequately.   LABORATORY FINDINGS:  Revealed hemoglobin of 10.4, platelet count of  262,000, WBC count of 10.8. On postoperative day #2 the patient was without  complaint. Vital signs stable. Fundus is firm, nontender. Abdominal dressing  had been removed revealing incision that is clean, dry and intact. The  patient is ambulating well, tolerating a  regular diet without complaints of  nausea, vomiting. On postoperative day #3 the patient was without complaint.  Vital signs remained stable. Fundus was firm and nontender. Incision was  clean, dry and intact. Staples were removed and the patient was discharged  home.   CONDITION ON DISCHARGE:  Good.   DIET:  Regular as tolerated.   ACTIVITY:  No heavy lifting, no driving x2 weeks, no vaginal entry.   FOLLOW-UP:  Patient is to follow up in the office in 1 week for incision  check. She is to call for temperature greater than 100 degrees, persistent  nausea and vomiting, heavy vaginal bleeding and/or redness or drainage from  incisional site.   DISCHARGE MEDICATIONS:  1. Motrin 600 milligrams every 6 hours.  2. Prenatal vitamins one p.o. daily.  3. Colace one p.o. daily p.r.n.        CC/MEDQ  D:  03/10/2005  T:  03/10/2005  Job:  [id number]

## 2011-03-04 NOTE — Assessment & Plan Note (Signed)
[facility]                              [facility] OFFICE NOTE   NAME:Dana [name], TENLEIGH                         MRN:          [id number]  DATE:08/21/2006                            DOB:          [birth year]    This is a 45 year old woman here to establish with our practice, complaining  of what she thinks is a sinus infection.  She moved to [place] from [place] 2 years ago.  She had seen Dr. Bethanie Dicker for primary care  before transferring to [place].  She has a history of allergies which are fairly  well controlled by the current medications on a seasonal basis.  Now for the  last 3 weeks she has had increasing sinus pressure, headache, postnasal  drainage and sore throat.  There has been no fever and no coughing.   Other past medical history:  She is a G3, P2 having had 2 C-sections.  She  sees Dr. Candice Camp for gynecology care.  She has had LASIK surgery.  She  had a bone spur removed from a toe.  She has acid reflux which is well  controlled. She has a history of hypertension which first surfaced during a  pregnancy.  She was given Aldomet at that time.  Then her pressures became  more manageable.  She has been off of medication now for the past 2 years  with borderline blood pressures.   ALLERGIES:  None.   CURRENT MEDICATIONS:  1. Zyrtec 10 mg daily.  2. Flonase sprays daily.   HABITS:  She drinks some alcohol. She does not use tobacco.   SOCIAL HISTORY:  She is married.  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   FAMILY HISTORY:  Is remarkable for hypertension.   OBJECTIVE:  Height 64 inches.  Weight 180.  Blood pressure 132/90.  Pulse 88  and regular. Temperature 98.5 degrees.  GENERAL:  She is a bit overweight.  She is in no acute distress.  EYES:  Are clear.  EARS:  Are clear.  PHARYNX:  Clear.  NECK:  Is supple without lymphadenopathy, masses.  LUNGS:  Are clear.   ASSESSMENT AND PLAN:   PROBLEM:  1. Sinusitis.  She was given a Z-Pak.  2. Allergies stable.  She will continue with her current medications.  3. Borderline hypertension.  We talked about increasing exercise and      losing a little weight.  Will continue to follow this closely.    ______________________________  Tera Mater Clent Ridges, MD    SAF/MedQ  DD: 08/22/2006  DT: 08/22/2006  Job #: [id number]

## 2011-03-04 NOTE — Op Note (Signed)
Dana [name], BUFFKIN                ACCOUNT NO.:  [id number]   MEDICAL RECORD NO.:  [id number]          PATIENT TYPE:  INP   LOCATION:  9198                          FACILITY:  [facility]   PHYSICIAN:  Dineen Kid. Rana Snare, M.D.    DATE OF BIRTH:  20-Apr-1966   DATE OF PROCEDURE:  02/17/2005  DATE OF DISCHARGE:                                 OPERATIVE REPORT   PREOPERATIVE DIAGNOSES:  1.  Intrauterine pregnancy at 39 weeks.  2.  Previous cesarean section with desire of repeat.   POSTOPERATIVE DIAGNOSES:  1.  Intrauterine pregnancy at 39 weeks.  2.  Previous cesarean section with desire of repeat.   PROCEDURE:  Repeat low segment transverse cesarean section.   SURGEON:  Dineen Kid. Rana Snare, M.D.   ANESTHESIA:  Regional, spinal.   INDICATIONS:  [name] is a 45 year old G3, P1, A1, with an uncomplicated  pregnancy other than a history of chronic hypertension, on no medication,  also advanced maternal age and declined amniocentesis.  She desires repeat  cesarean section.  Her EDC was Feb 27, 2005, and she presents for this  today.  Risks and benefits were discussed and informed consent was obtained.   FINDINGS AT THE TIME OF SURGERY:  A viable female infant, Apgars 9 and 9, pH  arterial 7.33.  Weight is 7 pounds 4 ounces.   DESCRIPTION OF PROCEDURE:  After adequate analgesia, the patient was placed  in the supine position with a left lateral tilt.  She was sterilely prepped  and draped.  The bladder was sterilely drained with a Foley catheter.  The  previous Pfannenstiel skin incision was sharply excised.  The incision was  taken down sharply to the fascia, incised transversely, extended superiorly  and inferiorly off the bellies of the rectus muscle, which were separated  sharply in the midline.  The peritoneum was entered sharply.  A bladder flap  was created and placed behind a bladder blade.  A low segment myotomy  incision was made down to the infant's vertex, extended laterally with the  operator's fingertips.  The infant's vertex was delivered atraumatically,  the nose and pharynx suctioned.  A nuchal cord x1 was reduced and the infant  was then delivered, cord clamped and cut, and handed to the pediatricians  for resuscitation.  Cord blood was obtained for pH, also for the lab and  also CryoCell according to their instructions.  The placenta was then  extracted manually.  The uterus was then exteriorized, wiped clean with a  dry lap, and the line of the incision closed in two layers, first being a  running locking layer and the second being an imbricating layer of 0  Monocryl suture.  The uterus was placed back into the peritoneal cavity and  after a copious amount of irrigation adequate hemostasis was assured.  The  peritoneum was closed with 0 Monocryl, the rectus muscle plicated in the  midline, irrigation was applied, and after adequate hemostasis, the fascia  was closed with a #1 Vicryl in a running fashion,  irrigation was applied, and after adequate hemostasis the  skin was stapled,  Steri-Strips applied.  The patient tolerated the procedure well, was stable  on transfer to the recovery room.  The sponge and instrument count was  normal x3.  Estimated blood loss was 800 mL.  The patient received 1 g of  Rocephin after delivery of the placenta.      DCL/MEDQ  D:  02/17/2005  T:  02/17/2005  Job:  [id number]

## 2011-12-08 ENCOUNTER — Telehealth: Payer: Self-pay | Admitting: Family Medicine

## 2011-12-08 MED ORDER — TACROLIMUS 0.03 % EX OINT: TOPICAL_OINTMENT | Freq: Two times a day (BID) | CUTANEOUS | Status: DC

## 2011-12-08 NOTE — Telephone Encounter (Signed)
Use bid, call in a 100 gram tube with no rf

## 2011-12-08 NOTE — Telephone Encounter (Signed)
Pt called and sch a cpx for 01/26/12 and fasting labs on 01/03/12. Pt req to get renewal of Protopic ointment for eyelids, to last until appt date. Pls call in to [facility] on [address].

## 2011-12-08 NOTE — Telephone Encounter (Signed)
Script sent e-scribe 

## 2011-12-27 ENCOUNTER — Ambulatory Visit (INDEPENDENT_AMBULATORY_CARE_PROVIDER_SITE_OTHER): Payer: Managed Care, Other (non HMO) | Admitting: Family Medicine

## 2011-12-27 DIAGNOSIS — Z23 Encounter for immunization: Secondary | ICD-10-CM

## 2011-12-30 LAB — TB SKIN TEST: TB Skin Test: NEGATIVE mm

## 2012-01-03 ENCOUNTER — Other Ambulatory Visit (INDEPENDENT_AMBULATORY_CARE_PROVIDER_SITE_OTHER): Payer: Managed Care, Other (non HMO)

## 2012-01-03 DIAGNOSIS — Z0000 Encounter for general adult medical examination without abnormal findings: Secondary | ICD-10-CM

## 2012-01-03 LAB — BASIC METABOLIC PANEL
Chloride: 103 mEq/L (ref 96–112)
GFR: 85.46 mL/min (ref >60.00)
Potassium: 3.8 mEq/L (ref 3.5–5.1)

## 2012-01-03 LAB — CBC WITH DIFFERENTIAL/PLATELET
Basophils Absolute: 0 10*3/uL (ref 0.0–0.1)
Basophils Relative: 0.7 % (ref 0.0–3.0)
Eosinophils Absolute: 0.1 10*3/uL (ref 0.0–0.7)
Eosinophils Relative: 0.9 % (ref 0.0–5.0)
HCT: 40.7 % (ref 36.0–46.0)
Lymphs Abs: 2.1 10*3/uL (ref 0.7–4.0)
MCHC: 33.8 g/dL (ref 30.0–36.0)
MCV: 86.8 fl (ref 78.0–100.0)
Monocytes Absolute: 0.7 10*3/uL (ref 0.1–1.0)
Platelets: 329 10*3/uL (ref 150.0–400.0)
RBC: 4.68 Mil/uL (ref 3.87–5.11)

## 2012-01-03 LAB — POCT URINALYSIS DIPSTICK
Bilirubin, UA: NEGATIVE
Protein, UA: NEGATIVE
Spec Grav, UA: 1.025
Urobilinogen, UA: 0.2
pH, UA: 6

## 2012-01-03 LAB — HEPATIC FUNCTION PANEL
ALT: 14 U/L (ref 0–35)
AST: 18 U/L (ref 0–37)
Albumin: 4.1 g/dL (ref 3.5–5.2)
Alkaline Phosphatase: 48 U/L (ref 39–117)
Bilirubin, Direct: 0.1 mg/dL (ref 0.0–0.3)
Total Bilirubin: 0.9 mg/dL (ref 0.3–1.2)
Total Protein: 7.3 g/dL (ref 6.0–8.3)

## 2012-01-03 LAB — LIPID PANEL
Cholesterol: 168 mg/dL (ref 0–200)
LDL Cholesterol: 101 mg/dL — ABNORMAL HIGH (ref 0–99)
Total CHOL/HDL Ratio: 3
Triglycerides: 59 mg/dL (ref 0.0–149.0)

## 2012-01-05 NOTE — Progress Notes (Signed)
Quick Note:  Pt aware ______ 

## 2012-01-24 ENCOUNTER — Encounter: Payer: Self-pay | Admitting: Family Medicine

## 2012-01-26 ENCOUNTER — Encounter: Payer: Self-pay | Admitting: Family Medicine

## 2012-01-26 ENCOUNTER — Ambulatory Visit (INDEPENDENT_AMBULATORY_CARE_PROVIDER_SITE_OTHER): Payer: Managed Care, Other (non HMO) | Admitting: Family Medicine

## 2012-01-26 VITALS — BP 114/70 | HR 103 | Temp 98.8°F | Ht 63.75 in | Wt 185.0 lb

## 2012-01-26 DIAGNOSIS — L301 Dyshidrosis [pompholyx]: Secondary | ICD-10-CM

## 2012-01-26 DIAGNOSIS — Z0000 Encounter for general adult medical examination without abnormal findings: Secondary | ICD-10-CM

## 2012-01-26 MED ORDER — HALOBETASOL PROPIONATE 0.05 % EX CREA: TOPICAL_CREAM | Freq: Two times a day (BID) | CUTANEOUS | Status: AC

## 2012-01-26 NOTE — Progress Notes (Signed)
  Subjective:    Patient ID: Dana [name], female    DOB: February 10, 1966, 46 y.o.   MRN: [id number]  HPI 46 yr old female for a cpx. She feels well and her allergies are well controlled. She is only using Zyrtec and Pataday drops occasionally. She has had an itchy rash on the palm of her right hand for the past 5 months, which waxes and wanes.    Review of Systems  Constitutional: Negative.   HENT: Negative.   Eyes: Negative.   Respiratory: Negative.   Cardiovascular: Negative.   Gastrointestinal: Negative.   Genitourinary: Negative for dysuria, urgency, frequency, hematuria, flank pain, decreased urine volume, enuresis, difficulty urinating, pelvic pain and dyspareunia.  Musculoskeletal: Negative.   Skin: Negative.   Neurological: Negative.   Hematological: Negative.   Psychiatric/Behavioral: Negative.        Objective:   Physical Exam  Constitutional: She is oriented to person, place, and time. She appears well-developed and well-nourished. No distress.  HENT:  Head: Normocephalic and atraumatic.  Right Ear: External ear normal.  Left Ear: External ear normal.  Nose: Nose normal.  Mouth/Throat: Oropharynx is clear and moist. No oropharyngeal exudate.  Eyes: Conjunctivae and EOM are normal. Pupils are equal, round, and reactive to light. No scleral icterus.  Neck: Normal range of motion. Neck supple. No JVD present. No thyromegaly present.  Cardiovascular: Normal rate, regular rhythm, normal heart sounds and intact distal pulses.  Exam reveals no gallop and no friction rub.   No murmur heard. Pulmonary/Chest: Effort normal and breath sounds normal. No respiratory distress. She has no wheezes. She has no rales. She exhibits no tenderness.  Abdominal: Soft. Bowel sounds are normal. She exhibits no distension and no mass. There is no tenderness. There is no rebound and no guarding.  Musculoskeletal: Normal range of motion. She exhibits no edema and no tenderness.  Lymphadenopathy:   She has no cervical adenopathy.  Neurological: She is alert and oriented to person, place, and time. She has normal reflexes. No cranial nerve deficit. She exhibits normal muscle tone. Coordination normal.  Skin: Skin is warm and dry. Rash noted. No erythema.       Slight erythematous scaly rash on the left palm  Psychiatric: She has a normal mood and affect. Her behavior is normal. Judgment and thought content normal.          Assessment & Plan:  Well exam. Use Halobetasol cream for the eczema.

## 2012-02-02 ENCOUNTER — Encounter: Payer: Self-pay | Admitting: Family Medicine

## 2012-02-02 ENCOUNTER — Ambulatory Visit (INDEPENDENT_AMBULATORY_CARE_PROVIDER_SITE_OTHER): Payer: Managed Care, Other (non HMO) | Admitting: Family Medicine

## 2012-02-02 VITALS — BP 116/70 | HR 111 | Temp 98.6°F | Wt 182.0 lb

## 2012-02-02 DIAGNOSIS — J40 Bronchitis, not specified as acute or chronic: Secondary | ICD-10-CM

## 2012-02-02 MED ORDER — AZITHROMYCIN 250 MG PO TABS: ORAL_TABLET | ORAL | Status: AC

## 2012-02-02 MED ORDER — HYDROCODONE-HOMATROPINE 5-1.5 MG/5ML PO SYRP: 5.0000 mL | ORAL_SOLUTION | ORAL | Status: AC | PRN

## 2012-02-02 NOTE — Progress Notes (Signed)
  Subjective:    Patient ID: Dana [name], female    DOB: [birth year], 46 y.o.   MRN: [id number]  HPI Here for 6 days of stuffy head, PND, and coughing up green sputum. No fever. On Mucinex   Review of Systems  Constitutional: Negative.   HENT: Positive for congestion, postnasal drip and sinus pressure.   Eyes: Negative.   Respiratory: Positive for cough.        Objective:   Physical Exam  Constitutional: She appears well-developed and well-nourished.  HENT:  Right Ear: External ear normal.  Left Ear: External ear normal.  Nose: Nose normal.  Mouth/Throat: Oropharynx is clear and moist. No oropharyngeal exudate.  Eyes: Conjunctivae are normal.  Pulmonary/Chest: Effort normal. No respiratory distress. She has no rales.       Soft rhonchi and wheezes   Lymphadenopathy:    She has no cervical adenopathy.          Assessment & Plan:  Rest and drink fluids

## 2012-06-06 ENCOUNTER — Other Ambulatory Visit: Payer: Self-pay | Admitting: Family Medicine

## 2012-06-06 NOTE — Telephone Encounter (Signed)
Can we refill this? 

## 2014-09-08 ENCOUNTER — Other Ambulatory Visit: Payer: Self-pay | Admitting: Obstetrics and Gynecology

## 2014-09-12 LAB — CYTOLOGY - PAP

## 2016-10-04 ENCOUNTER — Ambulatory Visit: Payer: Managed Care, Other (non HMO) | Admitting: Family Medicine

## 2020-02-26 ENCOUNTER — Other Ambulatory Visit: Payer: Self-pay | Admitting: Obstetrics and Gynecology

## 2020-02-26 DIAGNOSIS — R928 Other abnormal and inconclusive findings on diagnostic imaging of breast: Secondary | ICD-10-CM

## 2020-03-04 ENCOUNTER — Other Ambulatory Visit: Payer: Self-pay

## 2020-03-04 ENCOUNTER — Ambulatory Visit: Payer: Managed Care, Other (non HMO)

## 2020-03-04 ENCOUNTER — Ambulatory Visit
Admission: RE | Admit: 2020-03-04 | Discharge: 2020-03-04 | Disposition: A | Discharge status: Home / Self Care | Payer: Managed Care, Other (non HMO) | Source: Ambulatory Visit | Attending: Obstetrics and Gynecology | Admitting: Obstetrics and Gynecology

## 2020-03-04 DIAGNOSIS — R928 Other abnormal and inconclusive findings on diagnostic imaging of breast: Secondary | ICD-10-CM

## 2022-01-15 DIAGNOSIS — K37 Unspecified appendicitis: Secondary | ICD-10-CM

## 2022-01-15 HISTORY — DX: Unspecified appendicitis: K37

## 2022-01-20 ENCOUNTER — Encounter (HOSPITAL_BASED_OUTPATIENT_CLINIC_OR_DEPARTMENT_OTHER): Payer: Self-pay | Admitting: Emergency Medicine

## 2022-01-20 ENCOUNTER — Emergency Department (HOSPITAL_BASED_OUTPATIENT_CLINIC_OR_DEPARTMENT_OTHER): Payer: Managed Care, Other (non HMO)

## 2022-01-20 ENCOUNTER — Other Ambulatory Visit: Payer: Self-pay

## 2022-01-20 ENCOUNTER — Inpatient Hospital Stay (HOSPITAL_BASED_OUTPATIENT_CLINIC_OR_DEPARTMENT_OTHER)
Admission: EM | Admit: 2022-01-20 | Discharge: 2022-01-24 | DRG: 395 | Disposition: A | Discharge status: Home / Self Care | Payer: Managed Care, Other (non HMO) | Attending: Surgery | Admitting: Surgery

## 2022-01-20 DIAGNOSIS — K36 Other appendicitis: Principal | ICD-10-CM | POA: Diagnosis present

## 2022-01-20 DIAGNOSIS — K219 Gastro-esophageal reflux disease without esophagitis: Secondary | ICD-10-CM | POA: Diagnosis present

## 2022-01-20 DIAGNOSIS — K3532 Acute appendicitis with perforation and localized peritonitis, without abscess: Secondary | ICD-10-CM | POA: Diagnosis present

## 2022-01-20 DIAGNOSIS — K529 Noninfective gastroenteritis and colitis, unspecified: Secondary | ICD-10-CM | POA: Diagnosis not present

## 2022-01-20 DIAGNOSIS — Z8249 Family history of ischemic heart disease and other diseases of the circulatory system: Secondary | ICD-10-CM

## 2022-01-20 DIAGNOSIS — Z79899 Other long term (current) drug therapy: Secondary | ICD-10-CM

## 2022-01-20 DIAGNOSIS — R1031 Right lower quadrant pain: Secondary | ICD-10-CM

## 2022-01-20 LAB — URINALYSIS, ROUTINE W REFLEX MICROSCOPIC
Bilirubin Urine: NEGATIVE
Glucose, UA: NEGATIVE mg/dL
Ketones, ur: 40 mg/dL — AB
Nitrite: NEGATIVE
Protein, ur: 30 mg/dL — AB
Specific Gravity, Urine: 1.03 (ref 1.005–1.030)
pH: 7 (ref 5.0–8.0)

## 2022-01-20 LAB — COMPREHENSIVE METABOLIC PANEL
ALT: 10 U/L (ref 0–44)
AST: 14 U/L — ABNORMAL LOW (ref 15–41)
Albumin: 4.3 g/dL (ref 3.5–5.0)
Alkaline Phosphatase: 55 U/L (ref 38–126)
Anion gap: 10 (ref 5–15)
BUN: 13 mg/dL (ref 6–20)
CO2: 24 mmol/L (ref 22–32)
Calcium: 9.6 mg/dL (ref 8.9–10.3)
Chloride: 100 mmol/L (ref 98–111)
Creatinine, Ser: 0.89 mg/dL (ref 0.44–1.00)
GFR, Estimated: >60 mL/min (ref >60)
Glucose, Bld: 109 mg/dL — ABNORMAL HIGH (ref 70–99)
Potassium: 3.5 mmol/L (ref 3.5–5.1)
Sodium: 134 mmol/L — ABNORMAL LOW (ref 135–145)
Total Bilirubin: 1.6 mg/dL — ABNORMAL HIGH (ref 0.3–1.2)
Total Protein: 8.1 g/dL (ref 6.5–8.1)

## 2022-01-20 LAB — CBC
HCT: 43.5 % (ref 36.0–46.0)
Hemoglobin: 14.6 g/dL (ref 12.0–15.0)
MCH: 29.3 pg (ref 26.0–34.0)
MCHC: 33.6 g/dL (ref 30.0–36.0)
MCV: 87.3 fL (ref 80.0–100.0)
Platelets: 271 10*3/uL (ref 150–400)
RBC: 4.98 MIL/uL (ref 3.87–5.11)
RDW: 13.5 % (ref 11.5–15.5)
WBC: 17.4 10*3/uL — ABNORMAL HIGH (ref 4.0–10.5)
nRBC: 0 % (ref 0.0–0.2)

## 2022-01-20 LAB — LIPASE, BLOOD: Lipase: 10 U/L — ABNORMAL LOW (ref 11–51)

## 2022-01-20 MED ORDER — PIPERACILLIN-TAZOBACTAM 3.375 G IVPB 30 MIN
3.3750 g | Freq: Once | INTRAVENOUS | Status: AC
  Administered 2022-01-20: 3.375 g via INTRAVENOUS
  Filled 2022-01-20: qty 50

## 2022-01-20 MED ORDER — HYDROMORPHONE HCL 1 MG/ML IJ SOLN
1.0000 mg | Freq: Once | INTRAMUSCULAR | Status: AC
  Administered 2022-01-20: 1 mg via INTRAVENOUS
  Filled 2022-01-20: qty 1

## 2022-01-20 MED ORDER — LACTATED RINGERS IV SOLN: INTRAVENOUS | Status: DC

## 2022-01-20 MED ORDER — ONDANSETRON HCL 4 MG/2ML IJ SOLN
4.0000 mg | Freq: Once | INTRAMUSCULAR | Status: AC
  Administered 2022-01-20: 4 mg via INTRAVENOUS
  Filled 2022-01-20: qty 2

## 2022-01-20 MED ORDER — HYDROMORPHONE HCL 1 MG/ML IJ SOLN
1.0000 mg | INTRAMUSCULAR | Status: DC | PRN
  Administered 2022-01-21: 1 mg via INTRAVENOUS
  Filled 2022-01-20: qty 1

## 2022-01-20 MED ORDER — LACTATED RINGERS IV BOLUS
1000.0000 mL | Freq: Once | INTRAVENOUS | Status: AC
  Administered 2022-01-20: 1000 mL via INTRAVENOUS

## 2022-01-20 MED ORDER — IOHEXOL 300 MG/ML  SOLN
100.0000 mL | Freq: Once | INTRAMUSCULAR | Status: AC | PRN
  Administered 2022-01-20: 100 mL via INTRAVENOUS

## 2022-01-20 NOTE — ED Triage Notes (Signed)
Pt arrives to ED with c/o RLQ abdominal pain. This started yesterday and worsened today. The pain is described as intermittent and cramping. Pt reports N/V/D x1 week ago.  ?

## 2022-01-20 NOTE — ED Provider Notes (Signed)
?[facility] ?Provider Note ? ? ?CSN: [id number] ?Arrival date & time: 01/20/22  1748 ? ?  ? ?History ? ?Chief Complaint  ?Patient presents with  ? Abdominal Pain  ? ? ?Dana [name] is a 56 y.o. female. ? ?HPI ?Patient reports he has some vomiting and diarrhea about 6 days ago.  Vomiting stopped 4 days ago.  She has had some sporadic diarrhea.  She reports she thought she was getting better and then yesterday she started getting pain in her lower and central abdomen she thought it was gas pain.  However the pain is continued to get more more severe.  She has a lot of pain now in her right side in the lower abdomen and significant pain with movement.  She reports she has had loss of appetite.  No documented fever.  No history of similar pain.  No pain burning urgency with urination. ?  ? ?Home Medications ?Prior to Admission medications   ?Medication Sig Start Date End Date Taking? Authorizing Provider  ?cetirizine (ZYRTEC ALLERGY) 10 MG tablet Take 10 mg by mouth as needed.     [provider]  ?Clindamycin-Benzoyl Per, Refr, gel APPLY TO AFFECTED AREA TWICE DAILY AS NEEDED 06/06/12   Laurey Morale, MD  ?olopatadine (PATANOL) 0.1 % ophthalmic solution 1 drop as needed.     [provider]  ?tacrolimus (PROTOPIC) 0.03 % ointment Apply topically as needed. 12/08/11   Laurey Morale, MD  ?   ? ?Allergies    ?Patient has no known allergies.   ? ?Review of Systems   ?Review of Systems ?10 systems reviewed negative except as per HPI ?Physical Exam ?Updated Vital Signs ?BP 111/71   Pulse 83   Temp 99 ?F (37.2 ?C) (Oral)   Resp 18   Ht '5\' 3"'$  (1.6 m)   Wt 73.5 kg   SpO2 96%   BMI 28.70 kg/m?  ?Physical Exam ?Constitutional:   ?   Comments: Patient is alert nontoxic.  Uncomfortable in appearance.  Clear mental status.  ?HENT:  ?   Mouth/Throat:  ?   Pharynx: Oropharynx is clear.  ?Eyes:  ?   Extraocular Movements: Extraocular movements intact.  ?Cardiovascular:  ?   Rate and  Rhythm: Normal rate and regular rhythm.  ?Pulmonary:  ?   Effort: Pulmonary effort is normal.  ?   Breath sounds: Normal breath sounds.  ?Abdominal:  ?   Comments: Abdomen soft.  Moderate to severe pain right lower quadrant to palpation.  Normal soft tissue without rash or erythema.  No CVA tenderness.  ?Musculoskeletal:     ?   General: No swelling or tenderness. Normal range of motion.  ?   Right lower leg: No edema.  ?   Left lower leg: No edema.  ?Skin: ?   General: Skin is warm and dry.  ?Neurological:  ?   General: No focal deficit present.  ?   Mental Status: She is oriented to person, place, and time.  ?   Coordination: Coordination normal.  ?Psychiatric:     ?   Mood and Affect: Mood normal.  ? ? ?ED Results / Procedures / Treatments   ?Labs ?(all labs ordered are listed, but only abnormal results are displayed) ?Labs Reviewed  ?LIPASE, BLOOD - Abnormal; Notable for the following components:  ?    Result Value  ? Lipase 10 (*)   ? All other components within normal limits  ?COMPREHENSIVE METABOLIC PANEL - Abnormal; Notable for  the following components:  ? Sodium 134 (*)   ? Glucose, Bld 109 (*)   ? AST 14 (*)   ? Total Bilirubin 1.6 (*)   ? All other components within normal limits  ?CBC - Abnormal; Notable for the following components:  ? WBC 17.4 (*)   ? All other components within normal limits  ?URINALYSIS, ROUTINE W REFLEX MICROSCOPIC - Abnormal; Notable for the following components:  ? Hgb urine dipstick SMALL (*)   ? Ketones, ur 40 (*)   ? Protein, ur 30 (*)   ? Leukocytes,Ua MODERATE (*)   ? All other components within normal limits  ? ? ?EKG ?None ? ?Radiology ?CT Abdomen Pelvis W Contrast ? ?Result Date: 01/20/2022 ?CLINICAL DATA:  RLQ abdominal pain (Age >= 14y) Pain onset yesterday, worsened today. EXAM: CT ABDOMEN AND PELVIS WITH CONTRAST TECHNIQUE: Multidetector CT imaging of the abdomen and pelvis was performed using the standard protocol following bolus administration of intravenous contrast.  RADIATION DOSE REDUCTION: This exam was performed according to the departmental dose-optimization program which includes automated exposure control, adjustment of the mA and/or kV according to patient size and/or use of iterative reconstruction technique. CONTRAST:  156m OMNIPAQUE IOHEXOL 300 MG/ML  SOLN COMPARISON:  None. FINDINGS: Lower chest: No acute airspace disease or pleural effusion. Hepatobiliary: No focal liver abnormality is seen. No gallstones, gallbladder wall thickening, or biliary dilatation. Pancreas: Unremarkable. No pancreatic ductal dilatation or surrounding inflammatory changes. Spleen: Normal in size without focal abnormality. Adrenals/Urinary Tract: Normal adrenal glands. No hydronephrosis or perinephric edema. Homogeneous renal enhancement with symmetric excretion on delayed phase imaging. No renal calculi focal renal abnormality. Urinary bladder is physiologically distended without wall thickening. Stomach/Bowel: There is marked inflammatory change with fat stranding and free fluid in the right pericolic gutter extending from the right lower quadrant to the subhepatic space. Marked wall thickening is seen of the mid and proximal ascending colon and to a lesser extent the cecum. Source of inflammation is felt to represent an inflamed medial colonic diverticulum, series 2, image 45. However, the appendix is not definitively identified, either normal or abnormal, and no definite appendiceal remnant is visualized. There is pan colonic diverticulosis, without additional site of diverticulitis. No terminal ileal inflammation. No small bowel obstruction or inflammation. Stomach is decompressed. Vascular/Lymphatic: Patent portal, splenic and mesenteric veins. Normal caliber abdominal aorta with mild atherosclerosis. No enlarged abdominopelvic lymph nodes. Reproductive: The uterus is enlarged and heterogeneous with multiple fibroids. 4.7 cm cystic structure is centered in the cervix, homogeneous  simple fluid density. Physiologic 18 mm follicular cyst in the right ovary, needs no further follow-up given size the left ovary is not definitively seen. Other: Inflammatory changes and free fluid in the right abdomen extending from the right lower quadrant to the subhepatic space in the right pericolic gutter. There is no organized collection or drainable abscess. Trace free fluid tracks into the pelvis. No free air. Musculoskeletal: L5-S1 degenerative disc disease with Modic endplate changes. 18 mm sclerotic density in the left proximal femur has no suspicious characteristics and is likely a prominent bone island. IMPRESSION: 1. Inflammatory changes and free fluid in the right lower quadrant extending from the right lower quadrant to the subhepatic space. Marked wall thickening of the mid and proximal ascending colon and to a lesser extent the cecum. Source of inflammation is felt to represent an inflamed medial proximal ascending colonic diverticulum. However, the appendix is not definitively identified, either normal or abnormal. The possibility of ruptured appendicitis without  remnant appendix is not entirely excluded, but felt less likely. Recommend surgical consultation. Consider short interval follow-up CT with enteric contrast after trial of antibiotics. 2. No perforation or drainable abscess. 3. Near pan colonic diverticulosis without additional site of diverticulitis. 4. 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 4.7 cm cystic structure is centered in the cervix, is indeterminate for nabothian cyst, cystic degeneration of fibroid, or fluid distending the endocervical canal. Recommend nonemergent pelvic ultrasound for characterization after resolution of acute event. Aortic Atherosclerosis (ICD10-I70.0). Electronically Signed   By: Keith Rake M.D.   On: 01/20/2022 21:59   ? ?Procedures ?Procedures  ? ? ?Medications Ordered in ED ?Medications  ?lactated ringers infusion ( Intravenous  New Bag/Given 01/20/22 2217)  ?HYDROmorphone (DILAUDID) injection 1 mg (has no administration in time range)  ?lactated ringers infusion (has no administration in time range)  ?lactated ringers bolus 1,000 mL (0 mLs

## 2022-01-21 DIAGNOSIS — K219 Gastro-esophageal reflux disease without esophagitis: Secondary | ICD-10-CM | POA: Diagnosis present

## 2022-01-21 DIAGNOSIS — K529 Noninfective gastroenteritis and colitis, unspecified: Secondary | ICD-10-CM | POA: Diagnosis present

## 2022-01-21 DIAGNOSIS — K36 Other appendicitis: Secondary | ICD-10-CM | POA: Diagnosis present

## 2022-01-21 DIAGNOSIS — Z79899 Other long term (current) drug therapy: Secondary | ICD-10-CM | POA: Diagnosis not present

## 2022-01-21 DIAGNOSIS — Z8249 Family history of ischemic heart disease and other diseases of the circulatory system: Secondary | ICD-10-CM | POA: Diagnosis not present

## 2022-01-21 DIAGNOSIS — K3532 Acute appendicitis with perforation and localized peritonitis, without abscess: Secondary | ICD-10-CM | POA: Diagnosis present

## 2022-01-21 LAB — HIV ANTIBODY (ROUTINE TESTING W REFLEX): HIV Screen 4th Generation wRfx: NONREACTIVE

## 2022-01-21 MED ORDER — LACTATED RINGERS IV SOLN: INTRAVENOUS | Status: DC

## 2022-01-21 MED ORDER — OXYCODONE HCL 5 MG PO TABS
10.0000 mg | ORAL_TABLET | ORAL | Status: DC | PRN
Start: 2022-01-21 — End: 2022-01-24

## 2022-01-21 MED ORDER — ACETAMINOPHEN 325 MG PO TABS
650.0000 mg | ORAL_TABLET | Freq: Four times a day (QID) | ORAL | Status: DC
  Administered 2022-01-21 – 2022-01-23 (×3): 650 mg via ORAL
  Filled 2022-01-21 (×3): qty 2

## 2022-01-21 MED ORDER — OLOPATADINE HCL 0.1 % OP SOLN
1.0000 [drp] | OPHTHALMIC | Status: DC | PRN
  Filled 2022-01-21: qty 5

## 2022-01-21 MED ORDER — METHOCARBAMOL 1000 MG/10ML IJ SOLN
500.0000 mg | Freq: Four times a day (QID) | INTRAVENOUS | Status: DC | PRN
  Filled 2022-01-21: qty 5

## 2022-01-21 MED ORDER — ONDANSETRON HCL 4 MG/2ML IJ SOLN: 4.0000 mg | Freq: Four times a day (QID) | INTRAMUSCULAR | Status: DC | PRN

## 2022-01-21 MED ORDER — HYDROMORPHONE HCL 1 MG/ML IJ SOLN
0.5000 mg | INTRAMUSCULAR | Status: DC | PRN
Start: 2022-01-21 — End: 2022-01-24

## 2022-01-21 MED ORDER — LORATADINE 10 MG PO TABS: 10.0000 mg | ORAL_TABLET | Freq: Every day | ORAL | Status: DC | PRN

## 2022-01-21 MED ORDER — PIPERACILLIN-TAZOBACTAM 3.375 G IVPB
3.3750 g | Freq: Three times a day (TID) | INTRAVENOUS | Status: DC
  Administered 2022-01-21 – 2022-01-23 (×6): 3.375 g via INTRAVENOUS
  Filled 2022-01-21 (×6): qty 50

## 2022-01-21 MED ORDER — KETOROLAC TROMETHAMINE 30 MG/ML IJ SOLN
15.0000 mg | Freq: Once | INTRAMUSCULAR | Status: AC
Start: 2022-01-21 — End: 2022-01-21
  Administered 2022-01-21: 15 mg via INTRAVENOUS
  Filled 2022-01-21: qty 1

## 2022-01-21 MED ORDER — PIPERACILLIN-TAZOBACTAM 3.375 G IVPB 30 MIN
3.3750 g | Freq: Four times a day (QID) | INTRAVENOUS | Status: DC
  Administered 2022-01-21 (×2): 3.375 g via INTRAVENOUS
  Filled 2022-01-21 (×3): qty 50

## 2022-01-21 MED ORDER — PROCHLORPERAZINE EDISYLATE 10 MG/2ML IJ SOLN
10.0000 mg | INTRAMUSCULAR | Status: DC | PRN
Start: 2022-01-21 — End: 2022-01-24

## 2022-01-21 MED ORDER — ENOXAPARIN SODIUM 40 MG/0.4ML IJ SOSY
40.0000 mg | PREFILLED_SYRINGE | INTRAMUSCULAR | Status: DC
  Administered 2022-01-21 – 2022-01-22 (×2): 40 mg via SUBCUTANEOUS
  Filled 2022-01-21 (×2): qty 0.4

## 2022-01-21 MED ORDER — ONDANSETRON HCL 4 MG/2ML IJ SOLN
4.0000 mg | Freq: Once | INTRAMUSCULAR | Status: AC
  Administered 2022-01-21: 4 mg via INTRAVENOUS
  Filled 2022-01-21: qty 2

## 2022-01-21 MED ORDER — SODIUM CHLORIDE 0.9 % IV SOLN
INTRAVENOUS | Status: DC
Start: 2022-01-21 — End: 2022-01-24

## 2022-01-21 MED ORDER — OXYCODONE HCL 5 MG PO TABS: 5.0000 mg | ORAL_TABLET | ORAL | Status: DC | PRN

## 2022-01-21 MED ORDER — SIMETHICONE 80 MG PO CHEW
80.0000 mg | CHEWABLE_TABLET | Freq: Four times a day (QID) | ORAL | Status: DC | PRN
Start: 2022-01-21 — End: 2022-01-24

## 2022-01-21 MED ORDER — DOCUSATE SODIUM 100 MG PO CAPS
100.0000 mg | ORAL_CAPSULE | Freq: Two times a day (BID) | ORAL | Status: DC
  Administered 2022-01-21: 100 mg via ORAL
  Filled 2022-01-21 (×2): qty 1

## 2022-01-21 NOTE — ED Notes (Signed)
Rounded on pt.  ? ?Pt c/o HA and is requesting pain med for the discomfort. MD made aware // pending new orders  ?

## 2022-01-21 NOTE — H&P (Signed)
? ?Admitting Physician: Nickola Major Merissa Renwick ? ?Service: General surgery ? ?CC: Abdominal pain ? ?Subjective  ? ?HPI: ?Dana [name] is an 56 y.o. female who is here for abdominal pain.  She has had about a week of not feeling well. At first it was vague abdominal pain and lack of appetite.  Eventually she developed right lower quadrant abdominal pain that was quite severe.  She was at [employer] for and took a little while to be transferred to [facility] waiting on a bed.  Zosyn in the ER seemed to improve the pain very quickly.  She still has a little tenderness in the right lower quadrant but says it is much better. ? ?Past Medical History:  ?Diagnosis Date  ? Allergy   ? GERD (gastroesophageal reflux disease)   ? Hypertension   ? ? ?Past Surgical History:  ?Procedure Laterality Date  ? bone spur    ? removed from toe  ? DILATION AND CURETTAGE OF UTERUS  12/2009  ? benign polyp, Dr. Corinna Capra  ? LASIK    ? ? ?Family History  ?Problem Relation Age of Onset  ? Hyperlipidemia Other   ? Hypertension Other   ? ? ?Social:  reports that she has never smoked. She has never used smokeless tobacco. She reports current alcohol use of about 2.0 standard drinks per week. She reports that she does not use drugs. ? ?Allergies: No Known Allergies ? ?Medications: ?Current Outpatient Medications  ?Medication Instructions  ? cetirizine (ZYRTEC ALLERGY) 10 mg, As needed  ? Clindamycin-Benzoyl Per, Refr, gel APPLY TO AFFECTED AREA TWICE DAILY AS NEEDED  ? olopatadine (PATANOL) 0.1 % ophthalmic solution 1 drop, As needed  ? tacrolimus (PROTOPIC) 0.03 % ointment As needed  ? ? ?ROS - all of the below systems have been reviewed with the patient and positives are indicated with bold text ?General: chills, fever or night sweats ?Eyes: blurry vision or double vision ?ENT: epistaxis or sore throat ?Allergy/Immunology: itchy/watery eyes or nasal congestion ?Hematologic/Lymphatic: bleeding problems, blood clots or swollen lymph nodes ?Endocrine:  temperature intolerance or unexpected weight changes ?Breast: new or changing breast lumps or nipple discharge ?Resp: cough, shortness of breath, or wheezing ?CV: chest pain or dyspnea on exertion ?GI: as per HPI ?GU: dysuria, trouble voiding, or hematuria ?MSK: joint pain or joint stiffness ?Neuro: TIA or stroke symptoms ?Derm: pruritus and skin lesion changes ?Psych: anxiety and depression ? ?Objective  ? ?PE ?Blood pressure 129/81, pulse 79, temperature 98.7 ?F (37.1 ?C), temperature source Oral, resp. rate 20, height '5\' 3"'$  (1.6 m), weight 73.5 kg, SpO2 96 %. ?Constitutional: NAD; conversant; no deformities ?Eyes: Moist conjunctiva; no lid lag; anicteric; PERRL ?Neck: Trachea midline; no thyromegaly ?Lungs: Normal respiratory effort; no tactile fremitus ?CV: RRR; no palpable thrills; no pitting edema ?GI: Abd Soft, mild to moderate RLQ tenderness; no palpable hepatosplenomegaly ?MSK: Normal range of motion of extremities; no clubbing/cyanosis ?Psychiatric: Appropriate affect; alert and oriented x3 ?Lymphatic: No palpable cervical or axillary lymphadenopathy ? ?Results for orders placed or performed during the hospital encounter of 01/20/22 (from the past 24 hour(s))  ?Lipase, blood     Status: Abnormal  ? Collection Time: 01/20/22  5:59 PM  ?Result Value Ref Range  ? Lipase 10 (L) 11 - 51 U/L  ?Comprehensive metabolic panel     Status: Abnormal  ? Collection Time: 01/20/22  5:59 PM  ?Result Value Ref Range  ? Sodium 134 (L) 135 - 145 mmol/L  ? Potassium 3.5 3.5 - 5.1  mmol/L  ? Chloride 100 98 - 111 mmol/L  ? CO2 24 22 - 32 mmol/L  ? Glucose, Bld 109 (H) 70 - 99 mg/dL  ? BUN 13 6 - 20 mg/dL  ? Creatinine, Ser 0.89 0.44 - 1.00 mg/dL  ? Calcium 9.6 8.9 - 10.3 mg/dL  ? Total Protein 8.1 6.5 - 8.1 g/dL  ? Albumin 4.3 3.5 - 5.0 g/dL  ? AST 14 (L) 15 - 41 U/L  ? ALT 10 0 - 44 U/L  ? Alkaline Phosphatase 55 38 - 126 U/L  ? Total Bilirubin 1.6 (H) 0.3 - 1.2 mg/dL  ? GFR, Estimated >60 >60 mL/min  ? Anion gap 10 5 - 15   ?CBC     Status: Abnormal  ? Collection Time: 01/20/22  5:59 PM  ?Result Value Ref Range  ? WBC 17.4 (H) 4.0 - 10.5 K/uL  ? RBC 4.98 3.87 - 5.11 MIL/uL  ? Hemoglobin 14.6 12.0 - 15.0 g/dL  ? HCT 43.5 36.0 - 46.0 %  ? MCV 87.3 80.0 - 100.0 fL  ? MCH 29.3 26.0 - 34.0 pg  ? MCHC 33.6 30.0 - 36.0 g/dL  ? RDW 13.5 11.5 - 15.5 %  ? Platelets 271 150 - 400 K/uL  ? nRBC 0.0 0.0 - 0.2 %  ?Urinalysis, Routine w reflex microscopic     Status: Abnormal  ? Collection Time: 01/20/22  5:59 PM  ?Result Value Ref Range  ? Color, Urine YELLOW YELLOW  ? APPearance CLEAR CLEAR  ? Specific Gravity, Urine 1.030 1.005 - 1.030  ? pH 7.0 5.0 - 8.0  ? Glucose, UA NEGATIVE NEGATIVE mg/dL  ? Hgb urine dipstick SMALL (A) NEGATIVE  ? Bilirubin Urine NEGATIVE NEGATIVE  ? Ketones, ur 40 (A) NEGATIVE mg/dL  ? Protein, ur 30 (A) NEGATIVE mg/dL  ? Nitrite NEGATIVE NEGATIVE  ? Leukocytes,Ua MODERATE (A) NEGATIVE  ? RBC / HPF 0-5 0 - 5 RBC/hpf  ? WBC, UA 0-5 0 - 5 WBC/hpf  ? Squamous Epithelial / LPF 0-5 0 - 5  ? Mucus PRESENT   ? ? ? ?Imaging Orders    ?     CT Abdomen Pelvis W Contrast    ?1. Inflammatory changes and free fluid in the right lower quadrant ?extending from the right lower quadrant to the subhepatic space. ?Marked wall thickening of the mid and proximal ascending colon and ?to a lesser extent the cecum. Source of inflammation is felt to ?represent an inflamed medial proximal ascending colonic ?diverticulum. However, the appendix is not definitively identified, ?either normal or abnormal. The possibility of ruptured appendicitis ?without remnant appendix is not entirely excluded, but felt less ?likely. Recommend surgical consultation. Consider short interval ?follow-up CT with enteric contrast after trial of antibiotics. ?2. No perforation or drainable abscess. ?3. Near pan colonic diverticulosis without additional site of ?diverticulitis. ?4. 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 4.7 cm ?cystic structure is centered  in the cervix, is indeterminate for ?nabothian cyst, cystic degeneration of fibroid, or fluid distending ?the endocervical canal. Recommend nonemergent pelvic ultrasound for ?characterization after resolution of acute event. ? ?Assessment and Plan  ? ?Dana [name] is an 56 y.o. female with abdominal pain.  Most likely she has ruptured appendicitis but it may be a colitis, gyn etiology or other issue as the CT was not clearly diagnostic.  I recommend treating this like ruptured appendicitis with IV antibiotics and limited diet.  We will watch how she responds to these treatments.  She  may need a repeat CT scan and possibly an IR drain at some point.  Hopefully she feels better quickly so she can make it to her trip to [place] on Thursday. ? ? ? ? ? ?Felicie Morn, MD ? ?[facility] ?Use [email or web address] to contact on call provider ? ?New Patient Billing: ?[phone] - High MDM ?

## 2022-01-21 NOTE — Progress Notes (Signed)
Pt arrived from [place] via [facility], alert and oriented x 4. Pain is abd tenderness but not requiring any pain med.  ?

## 2022-01-21 NOTE — ED Notes (Signed)
Report received and care resumed. ? ?Rounded on pt. Pt currently resting  in bed with husband at bedside. LR running at 172m/hr. VSS; pt denies any needs at this time. ? ? Will continue to monitor  ?

## 2022-01-22 LAB — BASIC METABOLIC PANEL
Anion gap: 8 (ref 5–15)
BUN: 8 mg/dL (ref 6–20)
CO2: 21 mmol/L — ABNORMAL LOW (ref 22–32)
Calcium: 8 mg/dL — ABNORMAL LOW (ref 8.9–10.3)
Chloride: 111 mmol/L (ref 98–111)
Creatinine, Ser: 0.91 mg/dL (ref 0.44–1.00)
GFR, Estimated: >60 mL/min (ref >60)
Glucose, Bld: 93 mg/dL (ref 70–99)
Potassium: 3.4 mmol/L — ABNORMAL LOW (ref 3.5–5.1)
Sodium: 140 mmol/L (ref 135–145)

## 2022-01-22 LAB — CBC
HCT: 34.3 % — ABNORMAL LOW (ref 36.0–46.0)
Hemoglobin: 11.6 g/dL — ABNORMAL LOW (ref 12.0–15.0)
MCH: 30.1 pg (ref 26.0–34.0)
MCHC: 33.8 g/dL (ref 30.0–36.0)
MCV: 89.1 fL (ref 80.0–100.0)
Platelets: 219 10*3/uL (ref 150–400)
RBC: 3.85 MIL/uL — ABNORMAL LOW (ref 3.87–5.11)
RDW: 13.2 % (ref 11.5–15.5)
WBC: 9.5 10*3/uL (ref 4.0–10.5)
nRBC: 0 % (ref 0.0–0.2)

## 2022-01-22 NOTE — Progress Notes (Signed)
Patient ID: Dana [name], female   DOB: Feb 23, 1966, 56 y.o.   MRN: [id number] ?[facility] Progress Note:   * No surgery found *  ?Subjective: ?Mental status is clear.  Complaints few--she is feeling much better on abx. ?Objective: ?Vital signs in last 24 hours: ?Temp:  [98.2 ?F (36.8 ?C)-98.8 ?F (37.1 ?C)] 98.8 ?F (37.1 ?C) (04/08 4098) ?Pulse Rate:  [66-88] 82 (04/08 0723) ?Resp:  [16-20] 16 (04/08 0723) ?BP: (106-129)/(67-81) 122/70 (04/08 1191) ?SpO2:  [96 %-100 %] 99 % (04/08 0723) ? ?Intake/Output from previous day: ?04/07 0701 - 04/08 0700 ?In: 1000 [I.V.:1000] ?Out: -  ?Intake/Output this shift: ?No intake/output data recorded. ? ?Physical Exam: Work of breathing is normal.  Sore but not as tender in the abdomen as she was yesterday.   ? ?Lab Results:  ?Results for orders placed or performed during the hospital encounter of 01/20/22 (from the past 48 hour(s))  ?Lipase, blood     Status: Abnormal  ? Collection Time: 01/20/22  5:59 PM  ?Result Value Ref Range  ? Lipase 10 (L) 11 - 51 U/L  ?  Comment: Performed at [facility], [address], [place] [employer], [place] [address]  ?Comprehensive metabolic panel     Status: Abnormal  ? Collection Time: 01/20/22  5:59 PM  ?Result Value Ref Range  ? Sodium 134 (L) 135 - 145 mmol/L  ? Potassium 3.5 3.5 - 5.1 mmol/L  ? Chloride 100 98 - 111 mmol/L  ? CO2 24 22 - 32 mmol/L  ? Glucose, Bld 109 (H) 70 - 99 mg/dL  ?  Comment: Glucose reference range applies only to samples taken after fasting for at least 8 hours.  ? BUN 13 6 - 20 mg/dL  ? Creatinine, Ser 0.89 0.44 - 1.00 mg/dL  ? Calcium 9.6 8.9 - 10.3 mg/dL  ? Total Protein 8.1 6.5 - 8.1 g/dL  ? Albumin 4.3 3.5 - 5.0 g/dL  ? AST 14 (L) 15 - 41 U/L  ? ALT 10 0 - 44 U/L  ? Alkaline Phosphatase 55 38 - 126 U/L  ? Total Bilirubin 1.6 (H) 0.3 - 1.2 mg/dL  ? GFR, Estimated >60 >60 mL/min  ?  Comment: (NOTE) ?Calculated using the CKD-EPI Creatinine Equation (2021) ?  ? Anion gap 10 5 - 15  ?  Comment:  Performed at [facility], [address] [place], [place] [address]  ?CBC     Status: Abnormal  ? Collection Time: 01/20/22  5:59 PM  ?Result Value Ref Range  ? WBC 17.4 (H) 4.0 - 10.5 K/uL  ? RBC 4.98 3.87 - 5.11 MIL/uL  ? Hemoglobin 14.6 12.0 - 15.0 g/dL  ? HCT 43.5 36.0 - 46.0 %  ? MCV 87.3 80.0 - 100.0 fL  ? MCH 29.3 26.0 - 34.0 pg  ? MCHC 33.6 30.0 - 36.0 g/dL  ? RDW 13.5 11.5 - 15.5 %  ? Platelets 271 150 - 400 K/uL  ? nRBC 0.0 0.0 - 0.2 %  ?  Comment: Performed at [facility], [address] [place], [place] [address]  ?Urinalysis, Routine w reflex microscopic     Status: Abnormal  ? Collection Time: 01/20/22  5:59 PM  ?Result Value Ref Range  ? Color, Urine YELLOW YELLOW  ? APPearance CLEAR CLEAR  ? Specific Gravity, Urine 1.030 1.005 - 1.030  ? pH 7.0 5.0 - 8.0  ? Glucose, UA NEGATIVE NEGATIVE mg/dL  ? Hgb urine dipstick SMALL (A) NEGATIVE  ?  Bilirubin Urine NEGATIVE NEGATIVE  ? Ketones, ur 40 (A) NEGATIVE mg/dL  ? Protein, ur 30 (A) NEGATIVE mg/dL  ? Nitrite NEGATIVE NEGATIVE  ? Leukocytes,Ua MODERATE (A) NEGATIVE  ? RBC / HPF 0-5 0 - 5 RBC/hpf  ? WBC, UA 0-5 0 - 5 WBC/hpf  ? Squamous Epithelial / LPF 0-5 0 - 5  ? Mucus PRESENT   ?  Comment: Performed at [facility], [address] [place], [place] [address]  ?HIV Antibody (routine testing w rflx)     Status: None  ? Collection Time: 01/21/22  4:29 PM  ?Result Value Ref Range  ? HIV Screen 4th Generation wRfx Non Reactive Non Reactive  ?  Comment: Performed at [facility], [place] [address] [place], [place] [address]  ?CBC     Status: Abnormal  ? Collection Time: 01/21/22  4:29 PM  ?Result Value Ref Range  ? WBC 10.6 (H) 4.0 - 10.5 K/uL  ? RBC 4.07 3.87 - 5.11 MIL/uL  ? Hemoglobin 12.3 12.0 - 15.0 g/dL  ? HCT 36.0 36.0 - 46.0 %  ? MCV 88.5 80.0 - 100.0 fL  ? MCH 30.2 26.0 - 34.0 pg  ? MCHC 34.2 30.0 - 36.0 g/dL  ? RDW 13.1 11.5 - 15.5 %  ? Platelets 228 150 - 400 K/uL  ? nRBC 0.0 0.0 -  0.2 %  ?  Comment: Performed at [facility], [place] [address], [place], [place] [address]  ?Creatinine, serum     Status: None  ? Collection Time: 01/21/22  4:29 PM  ?Result Value Ref Range  ? Creatinine, Ser 0.92 0.44 - 1.00 mg/dL  ? GFR, Estimated >60 >60 mL/min  ?  Comment: (NOTE) ?Calculated using the CKD-EPI Creatinine Equation (2021) ?Performed at [facility], [place] [address] [place], [place] ?[address] ?  ?Basic metabolic panel     Status: Abnormal  ? Collection Time: 01/22/22 12:46 AM  ?Result Value Ref Range  ? Sodium 140 135 - 145 mmol/L  ? Potassium 3.4 (L) 3.5 - 5.1 mmol/L  ? Chloride 111 98 - 111 mmol/L  ? CO2 21 (L) 22 - 32 mmol/L  ? Glucose, Bld 93 70 - 99 mg/dL  ?  Comment: Glucose reference range applies only to samples taken after fasting for at least 8 hours.  ? BUN 8 6 - 20 mg/dL  ? Creatinine, Ser 0.91 0.44 - 1.00 mg/dL  ? Calcium 8.0 (L) 8.9 - 10.3 mg/dL  ? GFR, Estimated >60 >60 mL/min  ?  Comment: (NOTE) ?Calculated using the CKD-EPI Creatinine Equation (2021) ?  ? Anion gap 8 5 - 15  ?  Comment: Performed at [facility], [place] [address], [place], [place] [address]  ?CBC     Status: Abnormal  ? Collection Time: 01/22/22 12:46 AM  ?Result Value Ref Range  ? WBC 9.5 4.0 - 10.5 K/uL  ? RBC 3.85 (L) 3.87 - 5.11 MIL/uL  ? Hemoglobin 11.6 (L) 12.0 - 15.0 g/dL  ? HCT 34.3 (L) 36.0 - 46.0 %  ? MCV 89.1 80.0 - 100.0 fL  ? MCH 30.1 26.0 - 34.0 pg  ? MCHC 33.8 30.0 - 36.0 g/dL  ? RDW 13.2 11.5 - 15.5 %  ? Platelets 219 150 - 400 K/uL  ? nRBC 0.0 0.0 - 0.2 %  ?  Comment: Performed at [facility], [place] [address], [place],  [address]  ? ? ?Radiology/Results: ?CT Abdomen  Pelvis W Contrast ? ?Result Date: 01/20/2022 ?CLINICAL DATA:  RLQ abdominal pain (Age >= 14y) Pain onset yesterday, worsened today. EXAM: CT ABDOMEN AND PELVIS WITH CONTRAST TECHNIQUE: Multidetector CT imaging of the abdomen and pelvis was performed using the standard protocol following bolus administration of  intravenous contrast. RADIATION DOSE REDUCTION: This exam was performed according to the departmental dose-optimization program which includes automated exposure control, adjustment of the mA and/or kV according to patient size and/or use of iterative reconstruction technique. CONTRAST:  148m OMNIPAQUE IOHEXOL 300 MG/ML  SOLN COMPARISON:  None. FINDINGS: Lower chest: No acute airspace disease or pleural effusion. Hepatobiliary: No focal liver abnormality is seen. No gallstones, gallbladder wall thickening, or biliary dilatation. Pancreas: Unremarkable. No pancreatic ductal dilatation or surrounding inflammatory changes. Spleen: Normal in size without focal abnormality. Adrenals/Urinary Tract: Normal adrenal glands. No hydronephrosis or perinephric edema. Homogeneous renal enhancement with symmetric excretion on delayed phase imaging. No renal calculi focal renal abnormality. Urinary bladder is physiologically distended without wall thickening. Stomach/Bowel: There is marked inflammatory change with fat stranding and free fluid in the right pericolic gutter extending from the right lower quadrant to the subhepatic space. Marked wall thickening is seen of the mid and proximal ascending colon and to a lesser extent the cecum. Source of inflammation is felt to represent an inflamed medial colonic diverticulum, series 2, image 45. However, the appendix is not definitively identified, either normal or abnormal, and no definite appendiceal remnant is visualized. There is pan colonic diverticulosis, without additional site of diverticulitis. No terminal ileal inflammation. No small bowel obstruction or inflammation. Stomach is decompressed. Vascular/Lymphatic: Patent portal, splenic and mesenteric veins. Normal caliber abdominal aorta with mild atherosclerosis. No enlarged abdominopelvic lymph nodes. Reproductive: The uterus is enlarged and heterogeneous with multiple fibroids. 4.7 cm cystic structure is centered in the  cervix, homogeneous simple fluid density. Physiologic 18 mm follicular cyst in the right ovary, needs no further follow-up given size the left ovary is not definitively seen. Other: Inflammatory changes an

## 2022-01-23 LAB — BASIC METABOLIC PANEL
Anion gap: 4 — ABNORMAL LOW (ref 5–15)
BUN: 5 mg/dL — ABNORMAL LOW (ref 6–20)
CO2: 23 mmol/L (ref 22–32)
Calcium: 8 mg/dL — ABNORMAL LOW (ref 8.9–10.3)
Chloride: 111 mmol/L (ref 98–111)
Creatinine, Ser: 0.9 mg/dL (ref 0.44–1.00)
GFR, Estimated: >60 mL/min (ref >60)
Glucose, Bld: 97 mg/dL (ref 70–99)
Potassium: 3.3 mmol/L — ABNORMAL LOW (ref 3.5–5.1)
Sodium: 138 mmol/L (ref 135–145)

## 2022-01-23 LAB — CBC
HCT: 33.7 % — ABNORMAL LOW (ref 36.0–46.0)
Hemoglobin: 11.8 g/dL — ABNORMAL LOW (ref 12.0–15.0)
MCH: 30.7 pg (ref 26.0–34.0)
MCHC: 35 g/dL (ref 30.0–36.0)
MCV: 87.8 fL (ref 80.0–100.0)
Platelets: 254 10*3/uL (ref 150–400)
RBC: 3.84 MIL/uL — ABNORMAL LOW (ref 3.87–5.11)
RDW: 13.1 % (ref 11.5–15.5)
WBC: 7.6 10*3/uL (ref 4.0–10.5)
nRBC: 0 % (ref 0.0–0.2)

## 2022-01-23 MED ORDER — ACETAMINOPHEN 325 MG PO TABS: 650.0000 mg | ORAL_TABLET | Freq: Four times a day (QID) | ORAL | Status: DC | PRN

## 2022-01-23 MED ORDER — DOCUSATE SODIUM 100 MG PO CAPS: 100.0000 mg | ORAL_CAPSULE | Freq: Two times a day (BID) | ORAL | Status: DC | PRN

## 2022-01-23 MED ORDER — AMOXICILLIN-POT CLAVULANATE 875-125 MG PO TABS
1.0000 | ORAL_TABLET | Freq: Two times a day (BID) | ORAL | Status: DC
  Administered 2022-01-23 – 2022-01-24 (×3): 1 via ORAL
  Filled 2022-01-23 (×3): qty 1

## 2022-01-23 NOTE — Progress Notes (Signed)
Patient ID: Dana [name], female   DOB: 06-Jan-1966, 56 y.o.   MRN: [id number] ?[facility] Progress Note:   * No surgery found *  ?Subjective: ?Mental status is clear.  Complaints no abdominal pain. ?Objective: ?Vital signs in last 24 hours: ?Temp:  [98.1 ?F (36.7 ?C)-98.6 ?F (37 ?C)] 98.1 ?F (36.7 ?C) (04/09 0351) ?Pulse Rate:  [75-88] 75 (04/09 0351) ?Resp:  [16-18] 18 (04/09 0351) ?BP: (123-133)/(71-77) 130/74 (04/09 0351) ?SpO2:  [97 %-100 %] 98 % (04/09 0351) ? ?Intake/Output from previous day: ?04/08 0701 - 04/09 0700 ?In: 5035 [P.O.:180; I.V.:1500; IV Piggyback:50] ?Out: -  ?Intake/Output this shift: ?No intake/output data recorded. ? ?Physical Exam: Work of breathing is normal.  Minimal discomfort in the right lower quadrant ? ?Lab Results:  ?Results for orders placed or performed during the hospital encounter of 01/20/22 (from the past 48 hour(s))  ?HIV Antibody (routine testing w rflx)     Status: None  ? Collection Time: 01/21/22  4:29 PM  ?Result Value Ref Range  ? HIV Screen 4th Generation wRfx Non Reactive Non Reactive  ?  Comment: Performed at [facility], [place] [address] [place], [place] [address]  ?Basic metabolic panel     Status: Abnormal  ? Collection Time: 01/22/22 12:46 AM  ?Result Value Ref Range  ? Sodium 140 135 - 145 mmol/L  ? Potassium 3.4 (L) 3.5 - 5.1 mmol/L  ? Chloride 111 98 - 111 mmol/L  ? CO2 21 (L) 22 - 32 mmol/L  ? Glucose, Bld 93 70 - 99 mg/dL  ?  Comment: Glucose reference range applies only to samples taken after fasting for at least 8 hours.  ? BUN 8 6 - 20 mg/dL  ? Creatinine, Ser 0.91 0.44 - 1.00 mg/dL  ? Calcium 8.0 (L) 8.9 - 10.3 mg/dL  ? GFR, Estimated >60 >60 mL/min  ?  Comment: (NOTE) ?Calculated using the CKD-EPI Creatinine Equation (2021) ?  ? Anion gap 8 5 - 15  ?  Comment: Performed at [facility], [place] [address] [place], [place] [address]  ?CBC     Status: Abnormal  ? Collection Time: 01/22/22 12:46 AM  ?Result Value Ref Range  ? WBC 9.5 4.0  - 10.5 K/uL  ? RBC 3.85 (L) 3.87 - 5.11 MIL/uL  ? Hemoglobin 11.6 (L) 12.0 - 15.0 g/dL  ? HCT 34.3 (L) 36.0 - 46.0 %  ? MCV 89.1 80.0 - 100.0 fL  ? MCH 30.1 26.0 - 34.0 pg  ? MCHC 33.8 30.0 - 36.0 g/dL  ? RDW 13.2 11.5 - 15.5 %  ? Platelets 219 150 - 400 K/uL  ? nRBC 0.0 0.0 - 0.2 %  ?  Comment: Performed at [facility], [place] [address], [place],  [address]  ?Basic metabolic panel     Status: Abnormal  ? Collection Time: 01/23/22  2:56 AM  ?Result Value Ref Range  ? Sodium 138 135 - 145 mmol/L  ? Potassium 3.3 (L) 3.5 - 5.1 mmol/L  ? Chloride 111 98 - 111 mmol/L  ? CO2 23 22 - 32 mmol/L  ? Glucose, Bld 97 70 - 99 mg/dL  ?  Comment: Glucose reference range applies only to samples taken after fasting for at least 8 hours.  ? BUN 5 (L) 6 - 20 mg/dL  ? Creatinine, Ser 0.90 0.44 - 1.00 mg/dL  ? Calcium 8.0 (L) 8.9 - 10.3 mg/dL  ? GFR, Estimated >60 >60 mL/min  ?  Comment: (NOTE) ?Calculated using the CKD-EPI Creatinine Equation (2021) ?  ? Anion gap 4 (L) 5 - 15  ?  Comment: Performed at [facility], [place] [address], [place], [place] [address]  ?CBC     Status: Abnormal  ? Collection Time: 01/23/22  2:56 AM  ?Result Value Ref Range  ? WBC 7.6 4.0 - 10.5 K/uL  ? RBC 3.84 (L) 3.87 - 5.11 MIL/uL  ? Hemoglobin 11.8 (L) 12.0 - 15.0 g/dL  ? HCT 33.7 (L) 36.0 - 46.0 %  ? MCV 87.8 80.0 - 100.0 fL  ? MCH 30.7 26.0 - 34.0 pg  ? MCHC 35.0 30.0 - 36.0 g/dL  ? RDW 13.1 11.5 - 15.5 %  ? Platelets 254 150 - 400 K/uL  ? nRBC 0.0 0.0 - 0.2 %  ?  Comment: Performed at [facility], [place] [address] [place], [place] [address]  ? ? ?Radiology/Results: ?No results found. ? ?Anti-infectives: ?Anti-infectives (From admission, onward)  ? ? Start     Dose/Rate Route Frequency Ordered Stop  ? 01/23/22 1000  amoxicillin-clavulanate (AUGMENTIN) 875-125 MG per tablet 1 tablet       ? 1 tablet Oral Every 12 hours 01/23/22 0907    ? 01/21/22 1700  piperacillin-tazobactam (ZOSYN) IVPB 3.375 g  Status:  Discontinued       ? 3.375  g ?12.5 mL/hr over 240 Minutes Intravenous Every 8 hours 01/21/22 1601 01/23/22 0907  ? 01/21/22 0715  piperacillin-tazobactam (ZOSYN) IVPB 3.375 g  Status:  Discontinued       ? 3.375 g ?100 mL/hr over 30 Minutes Intravenous Every 6 hours 01/21/22 0707 01/21/22 1601  ? 01/20/22 2230  piperacillin-tazobactam (ZOSYN) IVPB 3.375 g       ? 3.375 g ?100 mL/hr over 30 Minutes Intravenous  Once 01/20/22 2220 01/21/22 0100  ? ?  ? ? ?Assessment/Plan: ?Problem List: ?Patient Active Problem List  ? Diagnosis Date Noted  ? Ruptured appendicitis 01/21/2022  ? Colitis 01/20/2022  ? 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 ACUTE SINUSITIS, UNSPECIFIED 02/04/2008  ? HYPERTENSION 04/16/2007  ? ALLERGIC RHINITIS 04/16/2007  ? GERD 04/16/2007  ? ? ?Will transition to Augmentin today.  Repeat CT in the AM with anticipation for discharge later tomorrow.   ?* No surgery found *  ? ? LOS: 2 days  ? ?Matt B. Hassell Done, MD, FACS ? ?[facility] ?[phone] to reach the surgeon on call.   ? ?01/23/2022 9:10 AM  ?

## 2022-01-23 NOTE — Progress Notes (Signed)
MD aware that patients IV infiltrated patient is refusing placement of another one. Patient is drinking plenty of fluids. Hopefully will be discharge tomorrow.Will continue to monitor.  ?  ?

## 2022-01-23 NOTE — Plan of Care (Signed)
?  Problem: Clinical Measurements: ?Goal: Ability to maintain clinical measurements within normal limits will improve ?Outcome: Progressing ?Goal: Will remain free from infection ?Outcome: Progressing ?Goal: Diagnostic test results will improve ?Outcome: Progressing ?Goal: Respiratory complications will improve ?Outcome: Progressing ?Goal: Cardiovascular complication will be avoided ?Outcome: Progressing ?  ?Problem: Coping: ?Goal: Level of anxiety will decrease ?Outcome: Progressing ?  ?Problem: Activity: ?Goal: Risk for activity intolerance will decrease ?Outcome: Progressing ?  ?

## 2022-01-24 LAB — CBC WITH DIFFERENTIAL/PLATELET
Abs Immature Granulocytes: 0.03 10*3/uL (ref 0.00–0.07)
Basophils Absolute: 0 10*3/uL (ref 0.0–0.1)
Basophils Relative: 1 %
Eosinophils Absolute: 0.3 10*3/uL (ref 0.0–0.5)
Eosinophils Relative: 3 %
HCT: 34.5 % — ABNORMAL LOW (ref 36.0–46.0)
Hemoglobin: 11.7 g/dL — ABNORMAL LOW (ref 12.0–15.0)
Immature Granulocytes: 0 %
Lymphocytes Relative: 37 %
Lymphs Abs: 3 10*3/uL (ref 0.7–4.0)
MCH: 29.7 pg (ref 26.0–34.0)
MCHC: 33.9 g/dL (ref 30.0–36.0)
MCV: 87.6 fL (ref 80.0–100.0)
Monocytes Absolute: 0.9 10*3/uL (ref 0.1–1.0)
Monocytes Relative: 11 %
Neutro Abs: 3.8 10*3/uL (ref 1.7–7.7)
Neutrophils Relative %: 48 %
Platelets: 267 10*3/uL (ref 150–400)
RBC: 3.94 MIL/uL (ref 3.87–5.11)
RDW: 12.9 % (ref 11.5–15.5)
WBC: 8 10*3/uL (ref 4.0–10.5)
nRBC: 0 % (ref 0.0–0.2)

## 2022-01-24 LAB — BASIC METABOLIC PANEL
Anion gap: 7 (ref 5–15)
BUN: 10 mg/dL (ref 6–20)
CO2: 22 mmol/L (ref 22–32)
Calcium: 8.6 mg/dL — ABNORMAL LOW (ref 8.9–10.3)
Chloride: 109 mmol/L (ref 98–111)
Creatinine, Ser: 0.81 mg/dL (ref 0.44–1.00)
GFR, Estimated: >60 mL/min (ref >60)
Glucose, Bld: 101 mg/dL — ABNORMAL HIGH (ref 70–99)
Potassium: 3.8 mmol/L (ref 3.5–5.1)
Sodium: 138 mmol/L (ref 135–145)

## 2022-01-24 MED ORDER — IBUPROFEN 800 MG PO TABS: 800.0000 mg | ORAL_TABLET | Freq: Three times a day (TID) | ORAL | Status: DC | PRN

## 2022-01-24 MED ORDER — IOHEXOL 9 MG/ML PO SOLN
ORAL | Status: AC
  Administered 2022-01-24: 500 mL
  Filled 2022-01-24: qty 1000

## 2022-01-24 MED ORDER — DOCUSATE SODIUM 100 MG PO CAPS: 100.0000 mg | ORAL_CAPSULE | Freq: Two times a day (BID) | ORAL | Status: DC | PRN

## 2022-01-24 MED ORDER — AMOXICILLIN-POT CLAVULANATE 875-125 MG PO TABS: 1.0000 | ORAL_TABLET | Freq: Two times a day (BID) | ORAL | 0 refills | Status: AC

## 2022-01-24 MED ORDER — ACETAMINOPHEN 325 MG PO TABS: 650.0000 mg | ORAL_TABLET | Freq: Four times a day (QID) | ORAL | Status: DC | PRN

## 2022-01-24 NOTE — Discharge Instructions (Signed)
WHEN TO CALL YOUR DOCTOR: ?Fever over 100.39F ?Increasing abdominal pain ?Not tolerating eating drinking. Worsening nausea and/or throwing up ? ?The clinic staff is available to answer your questions during regular business hours.  Please don?t hesitate to call and ask to speak to one of the nurses for clinical concerns.  If you have a medical emergency, go to the nearest emergency room or call 911.  A surgeon from [facility] is always on call at the hospital. ?[address], [place], [place], [place]  [address] ? [address] [place], [place], [place]   [address] ?[phone] ? [phone] ? FAX [phone] ?Web site: [email or web address] ? ?

## 2022-01-24 NOTE — Discharge Summary (Signed)
[facility] ?Discharge Summary  ? ?Patient ID: ?Dana [name] ?MRN: [id number] ?DOB/AGE: 1965/11/23 56 y.o. ? ?Admit date: 01/20/2022 ?Discharge date: 01/24/2022 ? ?Admitting Diagnosis: ?Colitis [K52.9] ?Right lower quadrant abdominal pain [R10.31] ?Ruptured appendicitis [K35.32] ? ? ?Discharge Diagnosis ?Ruptured appendicitis  ? ?Consultants ?None  ? ?Imaging: ?No results found. ? ?Procedures ?none ? ?Hospital Course:  ?56 year old female who presented to [facility] ED with abdominal pain.  Workup showed possible ruptured appendicitis.  Patient was transferred to [facility] and admitted for treatment of presumed ruptured appendicitis. She was given treated with IV antibiotic and diet tolerance monitored.  On HD3, the patient was voiding well, tolerating diet, ambulating well, pain well controlled, vital signs stable, and felt stable for discharge home.  Patient will follow up in our office in 3-4 weeks and knows to call with questions or concerns. She will be discharged on augmentin to complete a 14 day course of antibiotics. Return precautions regarding signs/symptoms of abscess formation discussed with patient and husband. ? ?Physical Exam: ?General:  Alert, NAD, pleasant, comfortable ?Resp: nonlabored on room air ?Abd:  Soft, ND, nontender to palpation ? ?I or a member of my team have reviewed this patient in the Controlled Substance Database. ? ?Allergies as of 01/24/2022   ?No Known Allergies ?  ? ?  ?Medication List  ?  ? ?TAKE these medications   ? ?acetaminophen 325 MG tablet ?Commonly known as: TYLENOL ?Take 2 tablets (650 mg total) by mouth every 6 (six) hours as needed for mild pain or moderate pain. ?  ?amoxicillin-clavulanate 875-125 MG tablet ?Commonly known as: AUGMENTIN ?Take 1 tablet by mouth every 12 (twelve) hours for 10 days. ?  ?cetirizine 10 MG tablet ?Commonly known as: ZYRTEC ?Take 10 mg by mouth as needed. ?  ?Clindamycin-Benzoyl Per (Refr) gel ?APPLY TO AFFECTED AREA TWICE  DAILY AS NEEDED ?  ?docusate sodium 100 MG capsule ?Commonly known as: COLACE ?Take 1 capsule (100 mg total) by mouth 2 (two) times daily as needed for mild constipation. ?  ?FISH OIL PO ?Take 1-2 capsules by mouth daily after breakfast. ?  ?ibuprofen 800 MG tablet ?Commonly known as: ADVIL ?Take 1 tablet (800 mg total) by mouth every 8 (eight) hours as needed for mild pain or moderate pain. ?  ?olopatadine 0.1 % ophthalmic solution ?Commonly known as: PATANOL ?1 drop as needed. ?  ? ?  ? ? ? ? Follow-up Information   ? ? Stechschulte, Nickola Major, MD. Go on 03/03/2022.   ?Specialty: Surgery ?Why: follow up on 5/18 at 10:20 am. please arrive 30 minutes early to complete check in :rocess and bring photo ID and insurance card if you have them ?Contact information: ?[address] ?[place] [address] ?[phone] ? ? ?  ?  ? ?  ?  ? ?  ? ? ?Signed: ?Winferd Humphrey , PA-C ?[facility] ?01/24/2022, 12:03 PM ?Please see Amion for pager number during day hours 7:00am-4:30pm ? ? ? ?

## 2022-01-24 NOTE — Progress Notes (Signed)
Pt discharged home in stable condition 

## 2022-03-21 ENCOUNTER — Ambulatory Visit: Payer: Self-pay | Admitting: Surgery

## 2022-04-05 ENCOUNTER — Encounter (HOSPITAL_BASED_OUTPATIENT_CLINIC_OR_DEPARTMENT_OTHER): Payer: Self-pay | Admitting: Surgery

## 2022-04-05 NOTE — Progress Notes (Signed)
Spoke w/ via phone for pre-op interview--- pt Lab needs dos----  urine preg           Lab results------ no COVID test -----patient states asymptomatic no test needed Arrive at ------- 1100 on 04-07-2022 NPO after MN NO Solid Food.  Clear liquids from MN until--- 1000 Med rec completed Medications to take morning of surgery ----- none Diabetic medication ----- n/a Patient instructed no nail polish to be worn day of surgery Patient instructed to bring photo id and insurance card day of surgery Patient aware to have Driver (ride ) / caregiver for 24 hours after surgery --- husband, jim Patient Special Instructions ----- n/a Pre-Op special Istructions ----- n/a Patient verbalized understanding of instructions that were given at this phone interview. Patient denies shortness of breath, chest pain, fever, cough at this phone interview.

## 2022-04-06 NOTE — Anesthesia Preprocedure Evaluation (Addendum)
Anesthesia Evaluation  Patient identified by MRN, date of birth, ID band Patient awake    Reviewed: Allergy & Precautions, NPO status , Patient's Chart, lab work & pertinent test results  Airway Mallampati: II  TM Distance: >3 FB Neck ROM: Full    Dental no notable dental hx. (+) Teeth Intact, Dental Advisory Given   Pulmonary neg pulmonary ROS,    Pulmonary exam normal breath sounds clear to auscultation       Cardiovascular hypertension, Normal cardiovascular exam Rhythm:Regular Rate:Normal     Neuro/Psych negative neurological ROS     GI/Hepatic GERD  ,  Endo/Other    Renal/GU      Musculoskeletal   Abdominal   Peds  Hematology   Anesthesia Other Findings   Reproductive/Obstetrics                            Anesthesia Physical Anesthesia Plan  ASA: 2  Anesthesia Plan: General   Post-op Pain Management: Toradol IV (intra-op)* and Tylenol PO (pre-op)*   Induction: Intravenous  PONV Risk Score and Plan: 4 or greater and Treatment may vary due to age or medical condition, Midazolam, Dexamethasone and Ondansetron  Airway Management Planned: Oral ETT  Additional Equipment: None  Intra-op Plan:   Post-operative Plan: Extubation in OR  Informed Consent: I have reviewed the patients History and Physical, chart, labs and discussed the procedure including the risks, benefits and alternatives for the proposed anesthesia with the patient or authorized representative who has indicated his/her understanding and acceptance.     Dental advisory given  Plan Discussed with:   Anesthesia Plan Comments:        Anesthesia Quick Evaluation

## 2022-04-07 ENCOUNTER — Other Ambulatory Visit: Payer: Self-pay

## 2022-04-07 ENCOUNTER — Encounter (HOSPITAL_BASED_OUTPATIENT_CLINIC_OR_DEPARTMENT_OTHER)
Admission: RE | Disposition: A | Discharge status: Home / Self Care | Payer: Self-pay | Source: Home / Self Care | Attending: Surgery

## 2022-04-07 ENCOUNTER — Ambulatory Visit (HOSPITAL_BASED_OUTPATIENT_CLINIC_OR_DEPARTMENT_OTHER)
Admission: RE | Admit: 2022-04-07 | Discharge: 2022-04-07 | Disposition: A | Discharge status: Home / Self Care | Payer: Managed Care, Other (non HMO) | Attending: Surgery | Admitting: Surgery

## 2022-04-07 ENCOUNTER — Ambulatory Visit (HOSPITAL_BASED_OUTPATIENT_CLINIC_OR_DEPARTMENT_OTHER): Payer: Managed Care, Other (non HMO) | Admitting: Anesthesiology

## 2022-04-07 ENCOUNTER — Encounter (HOSPITAL_BASED_OUTPATIENT_CLINIC_OR_DEPARTMENT_OTHER): Payer: Self-pay | Admitting: Surgery

## 2022-04-07 DIAGNOSIS — D259 Leiomyoma of uterus, unspecified: Secondary | ICD-10-CM | POA: Insufficient documentation

## 2022-04-07 DIAGNOSIS — N882 Stricture and stenosis of cervix uteri: Secondary | ICD-10-CM

## 2022-04-07 DIAGNOSIS — Z8719 Personal history of other diseases of the digestive system: Secondary | ICD-10-CM | POA: Diagnosis not present

## 2022-04-07 DIAGNOSIS — N857 Hematometra: Secondary | ICD-10-CM | POA: Insufficient documentation

## 2022-04-07 DIAGNOSIS — Z01818 Encounter for other preprocedural examination: Secondary | ICD-10-CM

## 2022-04-07 DIAGNOSIS — K37 Unspecified appendicitis: Secondary | ICD-10-CM | POA: Insufficient documentation

## 2022-04-07 HISTORY — DX: Stricture and stenosis of cervix uteri: N88.2

## 2022-04-07 HISTORY — DX: Family history of other specified conditions: Z84.89

## 2022-04-07 HISTORY — DX: Presence of spectacles and contact lenses: Z97.3

## 2022-04-07 HISTORY — DX: Constipation, unspecified: K59.00

## 2022-04-07 HISTORY — DX: Personal history of other infectious and parasitic diseases: Z86.19

## 2022-04-07 HISTORY — PX: LAPAROSCOPIC APPENDECTOMY: SHX408

## 2022-04-07 HISTORY — DX: Hematometra: N85.7

## 2022-04-07 HISTORY — PX: HYSTEROSCOPY: SHX211

## 2022-04-07 HISTORY — DX: Personal history of other diseases of the circulatory system: Z86.79

## 2022-04-07 LAB — TYPE AND SCREEN
ABO/RH(D): B POS
Antibody Screen: NEGATIVE

## 2022-04-07 LAB — CBC
HCT: 43.9 % (ref 36.0–46.0)
Hemoglobin: 14.6 g/dL (ref 12.0–15.0)
MCH: 29.9 pg (ref 26.0–34.0)
MCHC: 33.3 g/dL (ref 30.0–36.0)
MCV: 89.8 fL (ref 80.0–100.0)
Platelets: 297 10*3/uL (ref 150–400)
RBC: 4.89 MIL/uL (ref 3.87–5.11)
RDW: 14 % (ref 11.5–15.5)
WBC: 5.2 10*3/uL (ref 4.0–10.5)
nRBC: 0 % (ref 0.0–0.2)

## 2022-04-07 LAB — POCT PREGNANCY, URINE: Preg Test, Ur: NEGATIVE

## 2022-04-07 SURGERY — APPENDECTOMY, LAPAROSCOPIC
Anesthesia: General

## 2022-04-07 SURGERY — HYSTEROSCOPY
Anesthesia: Choice

## 2022-04-07 MED ORDER — BUPIVACAINE LIPOSOME 1.3 % IJ SUSP: 20.0000 mL | Freq: Once | INTRAMUSCULAR | Status: DC

## 2022-04-07 MED ORDER — ONDANSETRON HCL 4 MG/2ML IJ SOLN: 4.0000 mg | Freq: Once | INTRAMUSCULAR | Status: DC | PRN

## 2022-04-07 MED ORDER — ACETAMINOPHEN 500 MG PO TABS
ORAL_TABLET | ORAL | Status: AC
  Filled 2022-04-07: qty 2

## 2022-04-07 MED ORDER — DEXAMETHASONE SODIUM PHOSPHATE 4 MG/ML IJ SOLN
INTRAMUSCULAR | Status: DC | PRN
  Administered 2022-04-07: 8 mg via INTRAVENOUS

## 2022-04-07 MED ORDER — POVIDONE-IODINE 10 % EX SWAB: 1.0000 | Freq: Once | CUTANEOUS | Status: DC

## 2022-04-07 MED ORDER — MIDAZOLAM HCL 2 MG/2ML IJ SOLN
INTRAMUSCULAR | Status: AC
  Filled 2022-04-07: qty 2

## 2022-04-07 MED ORDER — CHLORHEXIDINE GLUCONATE CLOTH 2 % EX PADS: 6.0000 | MEDICATED_PAD | Freq: Once | CUTANEOUS | Status: DC

## 2022-04-07 MED ORDER — MIDAZOLAM HCL 2 MG/2ML IJ SOLN
INTRAMUSCULAR | Status: DC | PRN
  Administered 2022-04-07: 2 mg via INTRAVENOUS

## 2022-04-07 MED ORDER — HYDROMORPHONE HCL 1 MG/ML IJ SOLN: 0.2500 mg | INTRAMUSCULAR | Status: DC | PRN

## 2022-04-07 MED ORDER — LACTATED RINGERS IV SOLN: INTRAVENOUS | Status: DC

## 2022-04-07 MED ORDER — FENTANYL CITRATE (PF) 100 MCG/2ML IJ SOLN
INTRAMUSCULAR | Status: DC | PRN
  Administered 2022-04-07 (×2): 50 ug via INTRAVENOUS

## 2022-04-07 MED ORDER — KETOROLAC TROMETHAMINE 30 MG/ML IJ SOLN: 30.0000 mg | Freq: Once | INTRAMUSCULAR | Status: DC | PRN

## 2022-04-07 MED ORDER — LIDOCAINE HCL (CARDIAC) PF 100 MG/5ML IV SOSY
PREFILLED_SYRINGE | INTRAVENOUS | Status: DC | PRN
  Administered 2022-04-07: 25 mg via INTRAVENOUS

## 2022-04-07 MED ORDER — ACETAMINOPHEN 500 MG PO TABS
1000.0000 mg | ORAL_TABLET | ORAL | Status: AC
  Administered 2022-04-07: 1000 mg via ORAL

## 2022-04-07 MED ORDER — KETOROLAC TROMETHAMINE 30 MG/ML IJ SOLN
INTRAMUSCULAR | Status: DC | PRN
  Administered 2022-04-07: 30 mg via INTRAVENOUS

## 2022-04-07 MED ORDER — GABAPENTIN 300 MG PO CAPS
ORAL_CAPSULE | ORAL | Status: AC
  Filled 2022-04-07: qty 1

## 2022-04-07 MED ORDER — ONDANSETRON HCL 4 MG/2ML IJ SOLN
INTRAMUSCULAR | Status: DC | PRN
  Administered 2022-04-07: 4 mg via INTRAVENOUS

## 2022-04-07 MED ORDER — FENTANYL CITRATE (PF) 100 MCG/2ML IJ SOLN
INTRAMUSCULAR | Status: AC
  Filled 2022-04-07: qty 2

## 2022-04-07 MED ORDER — ROCURONIUM BROMIDE 100 MG/10ML IV SOLN
INTRAVENOUS | Status: DC | PRN
  Administered 2022-04-07: 60 mg via INTRAVENOUS

## 2022-04-07 MED ORDER — CEFAZOLIN SODIUM-DEXTROSE 2-4 GM/100ML-% IV SOLN
INTRAVENOUS | Status: AC
  Filled 2022-04-07: qty 100

## 2022-04-07 MED ORDER — SODIUM CHLORIDE 0.9 % IV SOLN
INTRAVENOUS | Status: AC
  Filled 2022-04-07: qty 2

## 2022-04-07 MED ORDER — TRAMADOL HCL 50 MG PO TABS: 50.0000 mg | ORAL_TABLET | Freq: Four times a day (QID) | ORAL | 0 refills | Status: DC | PRN

## 2022-04-07 MED ORDER — CEFAZOLIN SODIUM-DEXTROSE 2-4 GM/100ML-% IV SOLN: 2.0000 g | INTRAVENOUS | Status: DC

## 2022-04-07 MED ORDER — ACETAMINOPHEN 500 MG PO TABS: 1000.0000 mg | ORAL_TABLET | Freq: Once | ORAL | Status: DC

## 2022-04-07 MED ORDER — PROPOFOL 10 MG/ML IV BOLUS
INTRAVENOUS | Status: DC | PRN
  Administered 2022-04-07: 150 mg via INTRAVENOUS

## 2022-04-07 MED ORDER — OXYCODONE HCL 5 MG PO TABS: 5.0000 mg | ORAL_TABLET | Freq: Once | ORAL | Status: DC | PRN

## 2022-04-07 MED ORDER — GABAPENTIN 300 MG PO CAPS
300.0000 mg | ORAL_CAPSULE | ORAL | Status: AC
  Administered 2022-04-07: 300 mg via ORAL

## 2022-04-07 MED ORDER — LIDOCAINE-EPINEPHRINE 1 %-1:100000 IJ SOLN
INTRAMUSCULAR | Status: DC | PRN
  Administered 2022-04-07: 10 mL

## 2022-04-07 MED ORDER — SUGAMMADEX SODIUM 200 MG/2ML IV SOLN
INTRAVENOUS | Status: DC | PRN
  Administered 2022-04-07: 200 mg via INTRAVENOUS

## 2022-04-07 MED ORDER — SODIUM CHLORIDE 0.9 % IR SOLN
Status: DC | PRN
  Administered 2022-04-07: 1000 mL

## 2022-04-07 MED ORDER — OXYCODONE HCL 5 MG/5ML PO SOLN: 5.0000 mg | Freq: Once | ORAL | Status: DC | PRN

## 2022-04-07 MED ORDER — KETOROLAC TROMETHAMINE 15 MG/ML IJ SOLN: 15.0000 mg | INTRAMUSCULAR | Status: DC

## 2022-04-07 MED ORDER — SODIUM CHLORIDE 0.9 % IV SOLN
2.0000 g | INTRAVENOUS | Status: AC
  Administered 2022-04-07: 2 g via INTRAVENOUS

## 2022-04-07 MED ORDER — ROCURONIUM BROMIDE 10 MG/ML (PF) SYRINGE
PREFILLED_SYRINGE | INTRAVENOUS | Status: AC
  Filled 2022-04-07: qty 30

## 2022-04-07 SURGICAL SUPPLY — 71 items
ADH SKN CLS APL DERMABOND .7 (GAUZE/BANDAGES/DRESSINGS) ×1
APL PRP STRL LF DISP 70% ISPRP (MISCELLANEOUS) ×1
APPLIER CLIP 5 13 M/L LIGAMAX5 (MISCELLANEOUS)
APPLIER CLIP ROT 10 11.4 M/L (STAPLE)
APR CLP MED LRG 11.4X10 (STAPLE)
APR CLP MED LRG 5 ANG JAW (MISCELLANEOUS)
BIPOLAR CUTTING LOOP 21FR (ELECTRODE)
CABLE HIGH FREQUENCY MONO STRZ (ELECTRODE) IMPLANT
CATH ROBINSON RED A/P 16FR (CATHETERS) ×2 IMPLANT
CHLORAPREP W/TINT 26 (MISCELLANEOUS) ×2 IMPLANT
CLIP APPLIE 5 13 M/L LIGAMAX5 (MISCELLANEOUS) IMPLANT
CLIP APPLIE ROT 10 11.4 M/L (STAPLE) IMPLANT
CUTTER FLEX LINEAR 45M (STAPLE) ×2 IMPLANT
DECANTER SPIKE VIAL GLASS SM (MISCELLANEOUS) ×2 IMPLANT
DERMABOND ADVANCED (GAUZE/BANDAGES/DRESSINGS) ×1
DERMABOND ADVANCED .7 DNX12 (GAUZE/BANDAGES/DRESSINGS) ×1 IMPLANT
DEVICE MYOSURE LITE (MISCELLANEOUS) IMPLANT
DEVICE MYOSURE REACH (MISCELLANEOUS) IMPLANT
DILATOR CANAL MILEX (MISCELLANEOUS) IMPLANT
DRAIN CHANNEL 19F RND (DRAIN) IMPLANT
DRSG TELFA 3X8 NADH (GAUZE/BANDAGES/DRESSINGS) ×2 IMPLANT
ELECT REM PT RETURN 9FT ADLT (ELECTROSURGICAL) ×2
ELECTRODE REM PT RTRN 9FT ADLT (ELECTROSURGICAL) ×1 IMPLANT
ENDOLOOP SUT PDS II  0 18 (SUTURE)
ENDOLOOP SUT PDS II 0 18 (SUTURE) IMPLANT
EVACUATOR SILICONE 100CC (DRAIN) IMPLANT
GAUZE 4X4 16PLY ~~LOC~~+RFID DBL (SPONGE) ×6 IMPLANT
GLOVE BIO SURGEON STRL SZ7.5 (GLOVE) ×2 IMPLANT
GLOVE BIO SURGEON STRL SZ8 (GLOVE) ×2 IMPLANT
GLOVE BIOGEL PI IND STRL 8 (GLOVE) ×1 IMPLANT
GLOVE BIOGEL PI INDICATOR 8 (GLOVE) ×1
GLOVE SURG ORTHO 8.0 STRL STRW (GLOVE) ×4 IMPLANT
GOWN STRL REUS W/TWL LRG LVL3 (GOWN DISPOSABLE) ×2 IMPLANT
GOWN STRL REUS W/TWL XL LVL3 (GOWN DISPOSABLE) ×6 IMPLANT
IV NS IRRIG 3000ML ARTHROMATIC (IV SOLUTION) ×2 IMPLANT
KIT PROCEDURE FLUENT (KITS) ×2 IMPLANT
KIT TURNOVER CYSTO (KITS) ×2 IMPLANT
LOOP CUTTING BIPOLAR 21FR (ELECTRODE) IMPLANT
MYOSURE XL FIBROID (MISCELLANEOUS)
NDL INSUFFLATION 14GA 120MM (NEEDLE) ×1 IMPLANT
NEEDLE INSUFFLATION 14GA 120MM (NEEDLE) ×2 IMPLANT
PACK BASIN DAY SURGERY FS (CUSTOM PROCEDURE TRAY) ×2 IMPLANT
PACK VAGINAL MINOR WOMEN LF (CUSTOM PROCEDURE TRAY) ×2 IMPLANT
PAD DRESSING TELFA 3X8 NADH (GAUZE/BANDAGES/DRESSINGS) ×1 IMPLANT
PAD OB MATERNITY 4.3X12.25 (PERSONAL CARE ITEMS) ×2 IMPLANT
PAD PREP 24X48 CUFFED NSTRL (MISCELLANEOUS) ×2 IMPLANT
PENCIL SMOKE EVACUATOR (MISCELLANEOUS) ×2 IMPLANT
RELOAD 45 VASCULAR/THIN (ENDOMECHANICALS) IMPLANT
RELOAD STAPLE 45 2.5 WHT GRN (ENDOMECHANICALS) IMPLANT
RELOAD STAPLE 45 3.5 BLU ETS (ENDOMECHANICALS) IMPLANT
RELOAD STAPLE TA45 3.5 REG BLU (ENDOMECHANICALS) IMPLANT
SCISSORS LAP 5X35 DISP (ENDOMECHANICALS) IMPLANT
SEAL CERVICAL OMNI LOK (ABLATOR) IMPLANT
SEAL ROD LENS SCOPE MYOSURE (ABLATOR) ×2 IMPLANT
SET IRRIG TUBING LAPAROSCOPIC (IRRIGATION / IRRIGATOR) ×2 IMPLANT
SET TUBE SMOKE EVAC HIGH FLOW (TUBING) ×2 IMPLANT
SHEARS HARMONIC ACE PLUS 36CM (ENDOMECHANICALS) ×2 IMPLANT
SLEEVE ADV FIXATION 5X100MM (TROCAR) ×2 IMPLANT
SPONGE T-LAP 18X18 ~~LOC~~+RFID (SPONGE) ×2 IMPLANT
SUT ETHILON 3 0 PS 1 (SUTURE) IMPLANT
SUT MNCRL AB 4-0 PS2 18 (SUTURE) ×2 IMPLANT
SYS BAG RETRIEVAL 10MM (BASKET) ×2
SYSTEM BAG RETRIEVAL 10MM (BASKET) ×1 IMPLANT
SYSTEM TISS REMOVAL MYOSURE XL (MISCELLANEOUS) IMPLANT
TOWEL OR 17X26 10 PK STRL BLUE (TOWEL DISPOSABLE) IMPLANT
TRAY FOLEY MTR SLVR 14FR STAT (SET/KITS/TRAYS/PACK) IMPLANT
TRAY FOLEY MTR SLVR 16FR STAT (SET/KITS/TRAYS/PACK) IMPLANT
TRAY LAPAROSCOPIC (CUSTOM PROCEDURE TRAY) ×2 IMPLANT
TROCAR ADV FIXATION 5X100MM (TROCAR) ×2 IMPLANT
TROCAR XCEL BLUNT TIP 100MML (ENDOMECHANICALS) ×2 IMPLANT
WATER STERILE IRR 500ML POUR (IV SOLUTION) ×2 IMPLANT

## 2022-04-07 NOTE — Op Note (Unsigned)
NAMEAGAM, TUOHY MEDICAL RECORD NO: [id number] ACCOUNT NO: [id number] DATE OF BIRTH: [birth year] FACILITY: [facility] LOCATION: [facility] PHYSICIAN: Monia Sabal. 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  INDICATIONS:  The patient is a 56 year old who was being evaluated for appendicitis, had a CAT scan showing a 4.7 cm cystic area within the cervix.  She does have a history of a previous endometrial ablation.  She has not had any menstrual bleeding since  then.  Followup ultrasound evaluation shows 4.7 cm fluid collection within the cervix, appears to be consistent with blood versus mucus.  Unable to dilate the cervix due to cervical stenosis.  She wants to proceed with dilation and evacuation of this  hematometra within the cervix due to cervical stenosis.  We discussed the risks and benefits and informed consent was obtained.  She wanted to do this in accommodation of surgery.  When she is scheduled to have her appendectomy.  FINDINGS:  At time of surgery, moderate amount of mucousy bloody discharge from the cervix.  Once the cervix was dilated and evacuated otherwise normal-appearing cervix after stenosis was corrected.  PROCEDURE: Dilation of cervical stenosis and evacuation of hematometra.  DESCRIPTION OF PROCEDURE:  After adequate analgesia, the patient was placed in the dorsal lithotomy position.  She is sterilely prepped and draped.  Bladder was sterilely drained with a Foley catheter.  Graves speculum was placed.  Tenaculum placed on  the anterior lip of the cervix.  Paracervical block was placed with 1% Xylocaine, 1:100,000 epinephrine, total of 10 mL used.  Unable to identify the cervical os.  The paracervical block was used to make small injections at the base of the cervix  where  you could see a small shadow consistent with where the previous os appeared.  After we were able to get through.  We were able to see some mucousy discharge.  We used an 11 blade scalpel to incise it in a stellate fashion with opening and a moderate  amount of mucousy bloody fluid returned.  Cervix was then progressively dilated to a #25 [place] dilator.  It was then irrigated with sterile saline until no further fluid or mucus was returned.  Unable to pass through the internal os of the cervix, no  more bleeding or mucus was returned.  At this point, the cervix had been dilated to a #25 [place] dilator.  The tenaculum was then removed off the cervix.  Speculum removed.  The patient was then handed over to the care of Dr. Thermon Leyland for her  appendectomy. At this point of the procedure, bleeding was minimal.  Procedure was uncomplicated.  Sponge and instrument count was normal x3.  The patient received 2 grams of cefotetan preoperatively.  She will follow up with me in the office in 2-3  weeks for postoperative visit.   PUS D: 04/07/2022 1:38:35 pm T: 04/07/2022 3:10:00 pm  JOB: [id number]/ [id number]

## 2022-04-07 NOTE — Transfer of Care (Signed)
Immediate Anesthesia Transfer of Care Note  Patient: Dana [name]  Procedure(s) Performed: APPENDECTOMY LAPAROSCOPIC CERVICAL DIALATION  Patient Location: PACU  Anesthesia Type:General  Level of Consciousness: awake, alert , oriented and patient cooperative  Airway & Oxygen Therapy: Patient Spontanous Breathing  Post-op Assessment: Report given to RN and Post -op Vital signs reviewed and stable  Post vital signs: Reviewed and stable  Last Vitals:  Vitals Value Taken Time  BP 120/79 04/07/22 1415  Temp 37 C 04/07/22 1415  Pulse 82 04/07/22 1418  Resp 23 04/07/22 1418  SpO2 97 % 04/07/22 1418  Vitals shown include unvalidated device data.  Last Pain:  Vitals:   04/07/22 1124  TempSrc: Oral  PainSc: 0-No pain      Patients Stated Pain Goal: 4 (11/88/67 7373)  Complications: No notable events documented.

## 2022-04-07 NOTE — Discharge Instructions (Addendum)
 APPENDECTOMY POST OPERATIVE INSTRUCTIONS  Thinking Clearly  The anesthesia may cause you to feel different for 1 or 2 days. Do not drive, drink alcohol, or make any big decisions for at least 2 days.  Nutrition When you wake up, you will be able to drink small amounts of liquid. If you do not feel sick, you can slowly advance your diet to regular foods. Continue to drink lots of fluids, usually about 8 to 10 glasses per day. Eat a high-fiber diet so you don't strain during bowel movements. High-Fiber Foods Foods high in fiber include beans, bran cereals and whole-grain breads, peas, dried fruit (figs, apricots, and dates), raspberries, blackberries, strawberries, sweet corn, broccoli, baked potatoes with skin, plums, pears, apples, greens, and nuts. Activity Slowly increase your activity. Be sure to get up and walk every hour or so to prevent blood clots. No heavy lifting or strenuous activity for 4 weeks following surgery to prevent hernias at your incision sites It is normal to feel tired. You may need more sleep than usual.  Get your rest but make sure to get up and move around frequently to prevent blood clots and pneumonia.  Work and Return to School You can go back to work when you feel well enough. Discuss the timing with your surgeon. You can usually go back to school or work 1 week or less after an operation for an unruptured appendix and up to 2 weeks after a ruptured appendix. If your work requires heavy lifting or strenuous activity you need to be placed on light duty for 4 weeks following surgery. You can return to gym class, sports or other physical activities 4 weeks after surgery.  Wound Care Always wash your hands before and after touching near your incision site. Do not soak in a bathtub until cleared at your follow up appointment. You may take a shower 24 hours after surgery. A small amount of drainage from the incision is normal. If the drainage is thick and yellow or  the site is red, you may have an infection, so call your surgeon. If you have a drain in one of your incisions, it will be taken out in office when the drainage stops. Steri-Strips will fall off in 7 to 10 days or they will be removed during your first office visit. If you have dermabond glue covering over the incision, allow the glue to flake off on its own. Avoid wearing tight or rough clothing. It may rub your incisions and make it harder for them to heal. Protect the new skin, especially from the sun. The sun can burn and cause darker scarring. Your scar will heal in about 4 to 6 weeks and will become softer and continue to fade over the next year.  The cosmetic appearance of the incisions will improve over the course of the first year after surgery. Sensation around your incision will return in a few weeks or months.  Bowel Movements After intestinal surgery, you may have loose watery stools for several days. If watery diarrhea lasts longer than 3 days, contact your surgeon. Pain medication (narcotics) can cause constipation. Increase the fiber in your diet with high-fiber foods if you are constipated. You can take an over the counter stool softener like Colace to avoid constipation.  Additional over the counter medications can also be used if Colace isn't sufficient (for example, Milk of Magnesia or Miralax).  Pain The amount of pain is different for each person. Some people need only 1 to   3 doses of pain control medication, while others need more. Take alternating doses of tylenol and ibuprofen around the clock for the first five days following surgery.  This will provide a baseline of pain control and help with inflammation.  Take the narcotic pain medication in addition if needed for severe pain.  Contact Your Surgeon at [phone], if you have: Pain that will not go away Pain that gets worse A fever of more than 101F (38.3C) Repeated vomiting Swelling, redness, bleeding, or  bad-smelling drainage from your wound site Strong abdominal pain No bowel movement or unable to pass gas for 3 days Watery diarrhea lasting longer than 3 days  Pain Control The goal of pain control is to minimize pain, keep you moving and help you heal. Your surgical team will work with you on your pain plan. Most often a combination of therapies and medications are used to control your pain. You may also be given medication (local anesthetic) at the surgical site. This may help control your pain for several days. Extreme pain puts extra stress on your body at a time when your body needs to focus on healing. Do not wait until your pain has reached a level "10" or is unbearable before telling your doctor or nurse. It is much easier to control pain before it becomes severe. Following a laparoscopic procedure, pain is sometimes felt in the shoulder. This is due to the gas inserted into your abdomen during the procedure. Moving and walking helps to decrease the gas and the right shoulder pain.  Use the guide below for ways to manage your post-operative pain. Learn more by going to facs.org/safepaincontrol.  How Intense Is My Pain Common Therapies to Feel Better       I hardly notice my pain, and it does not interfere with my activities.  I notice my pain and it distracts me, but I can still do activities (sitting up, walking, standing).  Non-Medication Therapies  Ice (in a bag, applied over clothing at the surgical site), elevation, rest, meditation, massage, distraction (music, TV, play) walking and mild exercise Splinting the abdomen with pillows +  Non-Opioid Medications Acetaminophen (Tylenol) Non-steroidal anti-inflammatory drugs (NSAIDS) Aspirin, Ibuprofen (Motrin, Advil) Naproxen (Aleve) Take these as needed, when you feel pain. Both acetaminophen and NSAIDs help to decrease pain and swelling (inflammation).      My pain is hard to ignore and is more noticeable even when I  rest.  My pain interferes with my usual activities.  Non-Medication Therapies  +  Non-Opioid medications  Take on a regular schedule (around-the-clock) instead of as needed. (For example, Tylenol every 6 hours at 9:00 am, 3:00 pm, 9:00 pm, 3:00 am and Motrin every 6 hours at 12:00 am, 6:00 am, 12:00 pm, 6:00 pm)         I am focused on my pain, and I am not doing my daily activities.  I am groaning in pain, and I cannot sleep. I am unable to do anything.  My pain is as bad as it could be, and nothing else matters.  Non-Medication Therapies  +  Around-the-Clock Non-Opioid Medications  +  Short-acting opioids  Opioids should be used with other medications to manage severe pain. Opioids block pain and give a feeling of euphoria (feel high). Addiction, a serious side effect of opioids, is rare with short-term (a few days) use.  Examples of short-acting opioids include: Tramadol (Ultram), Hydrocodone (Norco, Vicodin), Hydromorphone (Dilaudid), Oxycodone (Oxycontin)     The above  directions have been adapted from the [employer] Surgical Patient Education Program.  Please refer to the ACS website if needed: StatOfficial.co.za.   Louanna Raw, MD [facility] [address], [place], [place], [place]  [address] ?  [address], [place], [place]   [phone] ? [phone] ? FAX [phone] Web site: [email or web address]   No ibuprofen, Advil, Aleve, Motrin, ketorolac, meloxicam, naproxen, or other NSAIDS until after 8 pm today if needed. No acetaminophen/Tylenol until after 5:30 pm today if needed.   Post Anesthesia Home Care Instructions  Activity: Get plenty of rest for the remainder of the day. A responsible individual must stay with you for 24 hours following the procedure.  For the next 24 hours, DO NOT: -Drive a  car -Paediatric nurse -Drink alcoholic beverages -Take any medication unless instructed by your physician -Make any legal decisions or sign important papers.  Meals: Start with liquid foods such as gelatin or soup. Progress to regular foods as tolerated. Avoid greasy, spicy, heavy foods. If nausea and/or vomiting occur, drink only clear liquids until the nausea and/or vomiting subsides. Call your physician if vomiting continues.  Special Instructions/Symptoms: Your throat may feel dry or sore from the anesthesia or the breathing tube placed in your throat during surgery. If this causes discomfort, gargle with warm salt water. The discomfort should disappear within 24 hours.     DISCHARGE INSTRUCTIONS: GYN Surgery The following instructions have been prepared to help you care for yourself upon your return home.  May take stool softner while taking narcotic pain medication to prevent constipation.  Drink plenty of water.  Personal hygiene:  Use sanitary pads for vaginal drainage, not tampons.  Shower the day after your procedure.  NO tub baths, pools or Jacuzzis for 2-3 weeks.  Wipe front to back after using the bathroom.  Activity and limitations:  Do NOT drive or operate any equipment for 24 hours. The effects of anesthesia are still present and drowsiness may result.  Do NOT rest in bed all day.  Walking is encouraged.  Walk up and down stairs slowly.  You may resume your normal activity in one to two days or as indicated by your physician.  Sexual activity: NO intercourse for at least 2 weeks after the procedure, or as indicated by your Doctor.  Diet: Eat a light meal as desired this evening. You may resume your usual diet tomorrow.  Return to Work: You may resume your work activities in one to two days or as indicated by Marine scientist.  What to expect after your surgery: Expect to have vaginal bleeding/discharge for 2-3 days and spotting for up to 10 days. It is not unusual  to have soreness for up to 1-2 weeks. You may have a slight burning sensation when you urinate for the first day. Mild cramps may continue for a couple of days. You may have a regular period in 2-6 weeks.  Call your doctor for any of the following:  Excessive vaginal bleeding or clotting, saturating and changing one pad every hour.  Inability to urinate 6 hours after discharge from hospital.  Pain not relieved by pain medication.  Fever of 100.4 F or greater.  Unusual vaginal discharge or odor.

## 2022-04-07 NOTE — Op Note (Signed)
Patient: Dana [name] (08/27/1966, [id number])  Date of Surgery: 04/07/2022   Preoperative Diagnosis: History of ruptured appendicitis LARGE CERVICAL CYST (HEMATOMETRA), CERVICAL STENOSIS   Postoperative Diagnosis: History of ruptured appendicitis LARGE CERVICAL CYST (HEMATOMETRA), CERVICAL STENOSIS   Surgical Procedure: Panel 1 APPENDECTOMY LAPAROSCOPIC:  Panel 2 CERVICAL DIALATION: 43329 (CPT)   Operative Team Members:  Surgeon(s) and Role: Panel 1:    * Ormand Senn, Nickola Major, MD - Primary Panel 2:    * Louretta Shorten, MD - Primary   Anesthesiologist: Barnet Glasgow, MD CRNA: Georgeanne Nim, CRNA   Anesthesia: General   Fluids:  No intake/output data recorded.  Complications: None  Drains:  none   Specimen:  ID Type Source Tests Collected by Time Destination  1 : appendix  Tissue PATH Appendix SURGICAL PATHOLOGY Jamilah Jean, Nickola Major, MD 04/07/2022 1343      Disposition:  PACU - hemodynamically stable.  Plan of Care: Discharge to home after PACU    Indications for Procedure: Nayda Riesen is a 56 y.o. female who presented with abdominal pain.  History, physical and imaging was concerning for appendicitis, so laparoscopic appendectomy was recommended for the patient.  The procedure itself, as well as the risks, benefits and alternatives were discussed with the patient.  Risks discussed included but were not limited to the risk of bleeding, infection, damage to nearby structures, need to convert to open procedure, incisional hernia, and the need for additional procedures or surgeries.  With this discussion complete and all questions answered the patient granted consent to proceed.  Findings: Relatively normal appearing appendix  Infection status: Patient: Private Patient Elective Case Case: Elective Infection Present At Time Of Surgery (PATOS): No intra-abdominal infection, please see Dr. Gregor Hams note for his portion of the procedure   Description of  Procedure:   On the date stated above, the patient was taken to the operating room suite and placed in supine positioning with the left arm tucked.  Sequential compression devices were placed on the lower extremities to prevent blood clots.  General endotracheal anesthesia was induced.  A foley catheter was placed to decompress the bladder, it was removed at the end of the case.   Preoperative antibiotics (cefazolin) were given within 30 minutes of incision.  The patient's abdomen was prepped and draped in the usual sterile fashion.  A time-out was completed verifying the correct patient, procedure, positioning and equipment needed for the case.  We began by anesthetizing the skin with local anesthetic and then making a 5 mm incision just below the umbilicus.  We dissected through the subcutaneous tissues to the fascia.  The fascia was grasped and elevated using a Kocher clamp.  A Veress needle was inserted into the abdomen and the abdomen was insufflated to 15 mmHg.  A 5 mm trocar was inserted in this position under optical guidance and then the abdomen was inspected.  There was no trauma to the underlying viscera with initial trocar placement.  Any abnormal findings, other than inflammation in the right lower quadrant, are listed above in the findings section.  Two additional trocars were placed, one 5 mm trocar suprapubically and one 12 mm trocar in the left lower quadrant.  These were placed under direct vision without any trauma to the underlying viscera.    The patient was then placed in head down, left side down positioning.  The appendix was identified and dissected free from its attachments to the abdominal wall, small intestine and cecum.  A window was created  in the mesoappendix using blunt dissection.  We used one 45 mm white load of the endoscopic linear stapler to divide the base of the appendix from the cecum.  Then the harmonic scalpel was used to divide the mesoappendix.  The appendix was  removed through the 12 mm port site in the left lower quadrant.  The operative field was dry on final inspection. The staple line was well formed.  At this point we directed our attention to closure.  The patient was moved back to a level position.  The 12 mm trocar site was closed at the fascial level using an 0-vicryl on a fascial suture passer.  The abdomen was desufflated.  The skin was closed using 4-0 Monocryl and dermabond.  All sponge and needle counts were correct at the end of the case.    Louanna Raw, MD General, Bariatric, & Minimally Invasive Surgery [facility], [place]

## 2022-04-07 NOTE — Anesthesia Postprocedure Evaluation (Signed)
Anesthesia Post Note  Patient: Dana [name]  Procedure(s) Performed: APPENDECTOMY LAPAROSCOPIC CERVICAL DIALATION     Patient location during evaluation: PACU Anesthesia Type: General Level of consciousness: awake and alert Pain management: pain level controlled Vital Signs Assessment: post-procedure vital signs reviewed and stable Respiratory status: spontaneous breathing, nonlabored ventilation, respiratory function stable and patient connected to nasal cannula oxygen Cardiovascular status: blood pressure returned to baseline and stable Postop Assessment: no apparent nausea or vomiting Anesthetic complications: no   No notable events documented.  Last Vitals:  Vitals:   04/07/22 1430 04/07/22 1445  BP: 127/80 120/85  Pulse: 66 64  Resp: 15 15  Temp:    SpO2: 96% 99%    Last Pain:  Vitals:   04/07/22 1445  TempSrc:   PainSc: 3                  Barnet Glasgow

## 2022-04-07 NOTE — H&P (Signed)
Admitting Physician: Dana [name]  Service: General surgery  CC: appendectomy  Subjective   HPI: Dana [name] is an 56 y.o. female who is here for appendectomy  Past Medical History:  Diagnosis Date   Appendicitis 01/2022   Cervical stenosis (uterine cervix)    Constipation    Family history of adverse reaction to anesthesia    mother--- ponv   Hematometra    uterine cervix   History of herpes genitalis    History of hypertension    per pt no medication for bp since 2006 , stated lost wt, followed by pcp   Wears contact lenses     Past Surgical History:  Procedure Laterality Date   CESAREAN SECTION     x2 ----  2003  and 02-17-2005 @ [place]   HYSTEROSCOPY W/ ENDOMETRIAL ABLATION  12/15/2009   benign polyp, Dr. Corinna Capra   TOE SURGERY     removal bone spur    Family History  Problem Relation Age of Onset   Hyperlipidemia Other    Hypertension Other     Social:  reports that she has never smoked. She has never used smokeless tobacco. She reports current alcohol use of about 2.0 standard drinks of alcohol per week. She reports that she does not use drugs.  Allergies: No Known Allergies  Medications: Current Outpatient Medications  Medication Instructions   cetirizine (ZYRTEC) 10 mg, Oral, As needed   docusate sodium (COLACE) 100 mg, Oral, 2 times daily PRN   Omega-3 Fatty Acids (FISH OIL PO) 1-2 capsules, Oral, Daily after breakfast   valACYclovir (VALTREX) 500 mg, Oral, Daily at bedtime    ROS - all of the below systems have been reviewed with the patient and positives are indicated with bold text General: chills, fever or night sweats Eyes: blurry vision or double vision ENT: epistaxis or sore throat Allergy/Immunology: itchy/watery eyes or nasal congestion Hematologic/Lymphatic: bleeding problems, blood clots or swollen lymph nodes Endocrine: temperature intolerance or unexpected weight changes Breast: new or changing breast lumps or nipple  discharge Resp: cough, shortness of breath, or wheezing CV: chest pain or dyspnea on exertion GI: as per HPI GU: dysuria, trouble voiding, or hematuria MSK: joint pain or joint stiffness Neuro: TIA or stroke symptoms Derm: pruritus and skin lesion changes Psych: anxiety and depression  Objective   PE Blood pressure 140/87, pulse 74, temperature (!) 97 F (36.1 C), temperature source Oral, resp. rate 16, height '5\' 3"'$  (1.6 m), weight 74.2 kg, SpO2 98 %. Constitutional: NAD; conversant; no deformities Eyes: Moist conjunctiva; no lid lag; anicteric; PERRL Neck: Trachea midline; no thyromegaly Lungs: Normal respiratory effort; no tactile fremitus CV: RRR; no palpable thrills; no pitting edema GI: Abd Soft, nontender; no palpable hepatosplenomegaly MSK: Normal range of motion of extremities; no clubbing/cyanosis Psychiatric: Appropriate affect; alert and oriented x3 Lymphatic: No palpable cervical or axillary lymphadenopathy  Results for orders placed or performed during the hospital encounter of 04/07/22 (from the past 24 hour(s))  Pregnancy, urine POC     Status: None   Collection Time: 04/07/22 11:10 AM  Result Value Ref Range   Preg Test, Ur NEGATIVE NEGATIVE  CBC     Status: None   Collection Time: 04/07/22 11:30 AM  Result Value Ref Range   WBC 5.2 4.0 - 10.5 K/uL   RBC 4.89 3.87 - 5.11 MIL/uL   Hemoglobin 14.6 12.0 - 15.0 g/dL   HCT 43.9 36.0 - 46.0 %   MCV 89.8 80.0 - 100.0 fL  MCH 29.9 26.0 - 34.0 pg   MCHC 33.3 30.0 - 36.0 g/dL   RDW 14.0 11.5 - 15.5 %   Platelets 297 150 - 400 K/uL   nRBC 0.0 0.0 - 0.2 %    Imaging Orders  No imaging studies ordered today    CT 01/20/22 1. Inflammatory changes and free fluid in the right lower quadrant  extending from the right lower quadrant to the subhepatic space.  Marked wall thickening of the mid and proximal ascending colon and  to a lesser extent the cecum. Source of inflammation is felt to  represent an inflamed  medial proximal ascending colonic  diverticulum. However, the appendix is not definitively identified,  either normal or abnormal. The possibility of ruptured appendicitis  without remnant appendix is not entirely excluded, but felt less  likely. Recommend surgical consultation. Consider short interval  follow-up CT with enteric contrast after trial of antibiotics.  2. No perforation or drainable abscess.  3. Near pan colonic diverticulosis without additional site of  diverticulitis.  4. 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 4.7 cm  cystic structure is centered in the cervix, is indeterminate for  nabothian cyst, cystic degeneration of fibroid, or fluid distending  the endocervical canal. Recommend nonemergent pelvic ultrasound for  characterization after resolution of acute event.     Assessment and Plan    Dana [name] is a 56 y.o. female who was admitted to [facility] with concern on CT for colitis, ascending colon diverticulitis or ruptured appendicitis from 01/20/22 to 01/24/22. She recovered on antibiotics and completed her course of antibiotics.   Based on her CT scan, I'm not sure she had ruptured appendicitis or a different explanation for her abdominal symptoms. According to the patient, Dr. Corinna Capra recommended surgery to address a cyst that has enlarged on ultrasound. She had a normal colonoscopy at [facility] three years ago. I recommend proceeding with laparoscopic appendectomy at the time of Dr. Gregor Hams operation. We discussed the procedure, its risks, benefits and alternatives and the patient granted consent to proceed.      ICD-10-CM   1. Preop examination  Z01.818 Type and screen [facility]    CBC    Type and screen [facility]    CBC    2. Pre-op testing  Z01.818 CBG per Guidelines for Diabetes Management for Patients Undergoing Surgery ([facility], [facility], and [facility] only)    CBG per protocol    POCT Pregnancy, Urine per protocol     CBG per Guidelines for Diabetes Management for Patients Undergoing Surgery ([facility], [facility], and [facility] only)    CBG per protocol    POCT Pregnancy, Urine per protocol       Felicie Morn, MD  [facility] Use [email or web address] to contact on call provider

## 2022-04-07 NOTE — H&P (Addendum)
Dana [name] is an 56 y.o. female presents today for surgical treatment of hematometra and cervical stenosis.  Having Pain with IC and noted 4.7cm cystic area on recent CT when evaluated for appendicitis. She has had no menses since endometrial ablation . Some dryness and Pain with IC CT shows multiple fibroids and 4.7cm cervical cyst.  No LMP recorded. Patient has had an ablation.    Past Medical History:  Diagnosis Date   Appendicitis 01/2022   Cervical stenosis (uterine cervix)    Constipation    Family history of adverse reaction to anesthesia    mother--- ponv   Hematometra    uterine cervix   History of herpes genitalis    History of hypertension    per pt no medication for bp since 2006 , stated lost wt, followed by pcp   Wears contact lenses     Past Surgical History:  Procedure Laterality Date   CESAREAN SECTION     x2 ----  2003  and 02-17-2005 @ [place]   HYSTEROSCOPY W/ ENDOMETRIAL ABLATION  12/15/2009   benign polyp, Dr. Corinna Capra   TOE SURGERY     removal bone spur    Family History  Problem Relation Age of Onset   Hyperlipidemia Other    Hypertension Other     Social History:  reports that she has never smoked. She has never used smokeless tobacco. She reports current alcohol use of about 2.0 standard drinks of alcohol per week. She reports that she does not use drugs.  Allergies: No Known Allergies  No medications prior to admission.    Review of Systems  Height '5\' 3"'$  (1.6 m), weight 74.8 kg. Physical Exam Cv RRR Lungs CTAB  Female Genitalia: Vulva: no masses, atrophy, or lesions. Mons: no erythema, excoriation, atrophy, lesions, masses, swelling, tenderness, or vesicles/ ulcers and normal. Labia Majora: no erythema, excoriation, atrophy, discoloration, lesions, masses, swelling, tenderness, or vesicles/ ulcers and normal. Labia Minora: no erythema, excoriation, atrophy, discoloration, lesions, vesicles, masses, swelling, or tenderness and normal. Introitus:  normal. Bartholin's Gland: normal. *Vagina: no discharge, erythema, atrophy, lesions, ulcers, swelling, masses, tenderness, prolapse, or blood present and normal. *Cervix: no lesions, discharge, bleeding, or cervical motion tenderness and grossly normal. *Uterus: fibroids, contour irregular, and enlarged 10 weeks size and midline, mobile, non-tender, and no uterine prolapse. *Urethral Meatus/ Urethra: no discharge, masses, or tenderness and normal meatus and well supported urethra. *Bladder: non-distended, non-tender, and no palpable mass. *Adnexa/Parametria: no tenderness or mass palpable.  Pelvic US shows 4.7cm cervical hematometra and multiple uterine fibroids, normal ovaries Attempt to dilate cervix in office unsuccessful due to discomfort  No results found for this or any previous visit (from the past 24 hour(s)).  No results found.  Assessment/Plan: Cervical hematometra - 4.7cm Pelvic pain Fibroids Cervical stenosis Plan dilation of cervical stenosis to evacuate hematometra under anesthesia while in the OR for planned appendectomy We discussed the procedure at length, the risks and benefits, alternatives and the possibility it may recur or not improve the pelvic pain.  Her questions were answered and she gives informed consent.  This patient has been seen and examined.   All of her questions were answered.  Labs and vital signs reviewed.  Informed consent has been obtained.  The History and Physical is current.  Luz Lex 04/07/2022, 6:46 AM

## 2022-04-07 NOTE — Anesthesia Procedure Notes (Signed)
Procedure Name: Intubation Date/Time: 04/07/2022 1:15 PM  Performed by: Georgeanne Nim, CRNAPre-anesthesia Checklist: Patient identified, Emergency Drugs available, Suction available, Patient being monitored and Timeout performed Patient Re-evaluated:Patient Re-evaluated prior to induction Oxygen Delivery Method: Circle system utilized Preoxygenation: Pre-oxygenation with 100% oxygen Induction Type: IV induction Ventilation: Mask ventilation without difficulty Laryngoscope Size: Mac and 4 Grade View: Grade I Tube type: Oral Tube size: 7.0 mm Number of attempts: 1 Airway Equipment and Method: Stylet Placement Confirmation: ETT inserted through vocal cords under direct vision, positive ETCO2, CO2 detector and breath sounds checked- equal and bilateral Secured at: 20 cm Tube secured with: Tape Dental Injury: Teeth and Oropharynx as per pre-operative assessment

## 2022-04-08 ENCOUNTER — Encounter (HOSPITAL_BASED_OUTPATIENT_CLINIC_OR_DEPARTMENT_OTHER): Payer: Self-pay | Admitting: Surgery

## 2022-04-08 LAB — SURGICAL PATHOLOGY

## 2022-09-27 NOTE — Progress Notes (Unsigned)
Dana [name] Phone: [phone] Subjective:   Dana [name], am serving as a scribe for Dr. Hulan Saas.  I'm seeing this patient by the request  of:  Fanny Bien, MD  CC: Right shoulder pain  [id number]  Dana [name] is a 56 y.o. female coming in with complaint of R shoulder pain. Patient states that she has had pain since October. Pain is in posterior and anterior aspect. [name] numbness but pain can radiate into tricep. Denies any neck pain. Rest will help but then her pain comes back especially with flexion.     Past Medical History:  Diagnosis Date   Appendicitis 01/2022   Cervical stenosis (uterine cervix)    Constipation    Family history of adverse reaction to anesthesia    mother--- ponv   Hematometra    uterine cervix   History of herpes genitalis    History of hypertension    per pt [name] medication for bp since 2006 , stated lost wt, followed by pcp   Wears contact lenses    Past Surgical History:  Procedure Laterality Date   CESAREAN SECTION     x2 ----  2003  and 02-17-2005 @ [place]   HYSTEROSCOPY N/A 04/07/2022   Procedure: CERVICAL DIALATION;  Surgeon: Louretta Shorten, MD;  Location: [facility];  Service: Gynecology;  Laterality: N/A;   HYSTEROSCOPY W/ ENDOMETRIAL ABLATION  12/15/2009   benign polyp, Dr. Corinna Capra   LAPAROSCOPIC APPENDECTOMY N/A 04/07/2022   Procedure: APPENDECTOMY LAPAROSCOPIC;  Surgeon: Felicie Morn, MD;  Location: [place];  Service: General;  Laterality: N/A;   TOE SURGERY     removal bone spur   Social History   Socioeconomic History   Marital status: Married    Spouse name: Not on file   Number of children: Not on file   Years of education: Not on file   Highest education level: Not on file  Occupational History   Not on file  Tobacco Use   Smoking status: Never   Smokeless tobacco: Never  Vaping Use   Vaping Use: Never  used  Substance and Sexual Activity   Alcohol use: Yes    Alcohol/week: 2.0 standard drinks of alcohol    Types: 2 drink(s) per week   Drug use: Never   Sexual activity: Not on file    Comment: per pt [name] menses since ablation in 2011  Other Topics Concern   Not on file  Social History Narrative   Not on file   Social Determinants of Health   Financial Resource Strain: Not on file  Food Insecurity: Not on file  Transportation Needs: Not on file  Physical Activity: Not on file  Stress: Not on file  Social Connections: Not on file   [name] Known Allergies Family History  Problem Relation Age of Onset   Hyperlipidemia Other    Hypertension Other      Current Outpatient Medications (Cardiovascular):    nitroGLYCERIN (NITRO-DUR) 0.2 mg/hr patch, Apply 1/4 of a patch to skin once daily.  Current Outpatient Medications (Respiratory):    cetirizine (ZYRTEC) 10 MG tablet, Take 10 mg by mouth as needed.  Current Outpatient Medications (Analgesics):    meloxicam (MOBIC) 15 MG tablet, Take 1 tablet (15 mg total) by mouth daily.   traMADol (ULTRAM) 50 MG tablet, Take 1 tablet (50 mg total) by mouth every 6 (six) hours as needed.   Current  Outpatient Medications (Other):    docusate sodium (COLACE) 100 MG capsule, Take 1 capsule (100 mg total) by mouth 2 (two) times daily as needed for mild constipation.   Omega-3 Fatty Acids (FISH OIL PO), Take 1-2 capsules by mouth daily after breakfast.   valACYclovir (VALTREX) 500 MG tablet, Take 500 mg by mouth at bedtime.   Vitamin D, Ergocalciferol, (DRISDOL) 1.25 MG (50000 UNIT) CAPS capsule, Take 1 capsule (50,000 Units total) by mouth every 7 (seven) days.   Reviewed prior external information including notes and imaging from  primary care provider As well as notes that were available from care everywhere and other healthcare systems.  Past medical history, social, surgical and family history all reviewed in electronic medical record.  [name]  pertanent information unless stated regarding to the chief complaint.   Review of Systems:  [name] headache, visual changes, nausea, vomiting, diarrhea, constipation, dizziness, abdominal pain, skin rash, fevers, chills, night sweats, weight loss, swollen lymph nodes, body aches, joint swelling, chest pain, shortness of breath, mood changes. POSITIVE muscle aches  Objective  Blood pressure (!) 124/98, pulse (!) 117, height '5\' 3"'$  (1.6 m), weight 180 lb (81.6 kg), SpO2 98 %.   General: [name] apparent distress alert and oriented x3 mood and affect normal, dressed appropriately.  HEENT: Pupils equal, extraocular movements intact  Respiratory: Patient's speak in full sentences and does not appear short of breath  Cardiovascular: [name] lower extremity edema, non tender, [name] erythema  Right shoulder exam does show the patient does have a positive crossover test noted.  Rotator cuff strength does appear to be intact.  Patient does have some tenderness over the acromioclavicular joint.  [name] significant pain over the posterior aspect of the shoulder.  Limited muscular skeletal ultrasound was performed and interpreted by Hulan Saas, M  Limited ultrasound shows the patient does have a cortical defect noted of the distal clavicle as well as on the proximal acromial area of the acromioclavicular joint.  This does appear to have a soft desquamation but [name] hard callus.  Patient does have calcific changes noted of the rotator cuff especially the subscapularis noted.  Questionable small partial-thickness tear noted of the supraspinatus but seems to be low-grade.  [name] significant retraction noted. Impression: Cortical irregularity noted of the distal clavicle consistent with potentially small nondisplaced fracture as well as calcific changes of the rotator cuff   Impression and Recommendations:    The above documentation has been reviewed and is accurate and complete Dana Pulley, DO

## 2022-09-28 ENCOUNTER — Ambulatory Visit: Payer: Self-pay

## 2022-09-28 ENCOUNTER — Encounter: Payer: Self-pay | Admitting: Family Medicine

## 2022-09-28 ENCOUNTER — Ambulatory Visit (INDEPENDENT_AMBULATORY_CARE_PROVIDER_SITE_OTHER): Payer: Managed Care, Other (non HMO) | Admitting: Family Medicine

## 2022-09-28 ENCOUNTER — Ambulatory Visit: Payer: Managed Care, Other (non HMO)

## 2022-09-28 VITALS — BP 124/98 | HR 117 | Ht 63.0 in | Wt 180.0 lb

## 2022-09-28 DIAGNOSIS — M25511 Pain in right shoulder: Secondary | ICD-10-CM | POA: Diagnosis not present

## 2022-09-28 MED ORDER — MELOXICAM 15 MG PO TABS: 15.0000 mg | ORAL_TABLET | Freq: Every day | ORAL | 0 refills | Status: DC

## 2022-09-28 MED ORDER — VITAMIN D (ERGOCALCIFEROL) 1.25 MG (50000 UNIT) PO CAPS: 50000.0000 [IU] | ORAL_CAPSULE | ORAL | 0 refills | Status: DC

## 2022-09-28 MED ORDER — NITROGLYCERIN 0.2 MG/HR TD PT24: MEDICATED_PATCH | TRANSDERMAL | 0 refills | Status: DC

## 2022-09-28 NOTE — Assessment & Plan Note (Signed)
Right shoulder pain.  Seems to be multifactorial.  Distal clavicle likely fracture noted.  Once weekly vitamin D prescribed for this and we discussed potential K2.  Discussed with patient about icing regimen and home exercises.  Patient work with Product/process development scientist to learn the home exercises in greater detail.  In addition of this we did discuss nitroglycerin secondary to the changes were noted this in the rotator cuff.  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  Patient also started on meloxicam.  Warned of potential side effects as well.  Follow-up with me again in 4 to 6 weeks and if continuing to have pain will consider injection and potentially formal physical therapy.

## 2022-09-28 NOTE — Patient Instructions (Addendum)
Happy Holidays Xray today Once weekly Vit D Nitro patches Nitroglycerin Protocol   Apply 1/4 nitroglycerin patch to affected area daily.  Change position of patch within the affected area every 24 hours.  You may experience a headache during the first 1-2 weeks of using the patch, these should subside.  If you experience headaches after beginning nitroglycerin patch treatment, you may take your preferred over the counter pain reliever.  Another side effect of the nitroglycerin patch is skin irritation or rash related to patch adhesive.  Please notify our office if you develop more severe headaches or rash, and stop the patch.  Tendon healing with nitroglycerin patch may require 12 to 24 weeks depending on the extent of injury.  Men should not use if taking Viagra, Cialis, or Levitra.   Do not use if you have migraines or rosacea. Meloxicam '15mg'$  daily for 10 days then as needed Ice for 76mn 2x a day Hands in peripheral vision Start PT exercises on the 26th See me in 4-6 weeks

## 2022-09-29 ENCOUNTER — Telehealth: Payer: Self-pay | Admitting: Family Medicine

## 2022-09-29 NOTE — Telephone Encounter (Signed)
Patient called stating that Dr Tamala Julian mentioned at her visit for her to have her Vit D level checked and she forgot. There is not an order in here. Does she need to have this lab done? If so, can the order be put in for her please?

## 2022-09-30 ENCOUNTER — Other Ambulatory Visit: Payer: Self-pay

## 2022-09-30 ENCOUNTER — Other Ambulatory Visit (INDEPENDENT_AMBULATORY_CARE_PROVIDER_SITE_OTHER): Payer: Managed Care, Other (non HMO)

## 2022-09-30 DIAGNOSIS — M2550 Pain in unspecified joint: Secondary | ICD-10-CM

## 2022-09-30 LAB — VITAMIN D 25 HYDROXY (VIT D DEFICIENCY, FRACTURES): VITD: 42.75 ng/mL (ref 30.00–100.00)

## 2022-09-30 LAB — VITAMIN B12: Vitamin B-12: 267 pg/mL (ref 211–911)

## 2022-09-30 NOTE — Telephone Encounter (Signed)
Labs ordered and patient notified.

## 2022-10-01 LAB — PTH, INTACT AND CALCIUM
Calcium: 9.6 mg/dL (ref 8.6–10.4)
PTH: 17 pg/mL (ref 16–77)

## 2022-10-01 LAB — CALCIUM, IONIZED: Calcium, Ion: 5 mg/dL (ref 4.7–5.5)

## 2022-10-03 ENCOUNTER — Other Ambulatory Visit: Payer: Managed Care, Other (non HMO)

## 2022-10-04 ENCOUNTER — Ambulatory Visit: Payer: Managed Care, Other (non HMO) | Admitting: Family Medicine

## 2022-10-27 NOTE — Progress Notes (Signed)
[place] Phone: [phone] Subjective:    I'm seeing this patient by the request  of:  Fanny Bien, MD  CC: Right shoulder pain follow-up  [id number]  09/28/2022 Right shoulder pain.  Seems to be multifactorial.  Distal clavicle likely fracture noted.  Once weekly vitamin D prescribed for this and we discussed potential K2.  Discussed with patient about icing regimen and home exercises.  Patient work with Product/process development scientist to learn the home exercises in greater detail.  In addition of this we did discuss nitroglycerin secondary to the changes were noted this in the rotator cuff.  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  Patient also started on meloxicam.  Warned of potential side effects as well.  Follow-up with me again in 4 to 6 weeks and if continuing to have pain will consider injection and potentially formal physical therapy.     Update 11/02/2022 Dana [name] is a 57 y.o. female coming in with complaint of R shoulder pain. Patient states right shoulder is better. She still has some issues with pain when she does certain things. Lifting over her head, sleeping with her hand above her head, and reaching behind to put a jacket on.      Past Medical History:  Diagnosis Date   Appendicitis 01/2022   Cervical stenosis (uterine cervix)    Constipation    Family history of adverse reaction to anesthesia    mother--- ponv   Hematometra    uterine cervix   History of herpes genitalis    History of hypertension    per pt no medication for bp since 2006 , stated lost wt, followed by pcp   Wears contact lenses    Past Surgical History:  Procedure Laterality Date   CESAREAN SECTION     x2 ----  2003  and 02-17-2005 @ [place]   HYSTEROSCOPY N/A 04/07/2022   Procedure: CERVICAL DIALATION;  Surgeon: Louretta Shorten, MD;  Location: [facility];  Service: Gynecology;  Laterality: N/A;   HYSTEROSCOPY W/ ENDOMETRIAL ABLATION  12/15/2009   benign polyp, Dr. Corinna Capra   LAPAROSCOPIC APPENDECTOMY N/A 04/07/2022   Procedure: APPENDECTOMY LAPAROSCOPIC;  Surgeon: Felicie Morn, MD;  Location: [place];  Service: General;  Laterality: N/A;   TOE SURGERY     removal bone spur   Social History   Socioeconomic History   Marital status: Married    Spouse name: Not on file   Number of children: Not on file   Years of education: Not on file   Highest education level: Not on file  Occupational History   Not on file  Tobacco Use   Smoking status: Never   Smokeless tobacco: Never  Vaping Use   Vaping Use: Never used  Substance and Sexual Activity   Alcohol use: Yes    Alcohol/week: 2.0 standard drinks of alcohol    Types: 2 drink(s) per week   Drug use: Never   Sexual activity: Not on file    Comment: per pt no menses since ablation in 2011  Other Topics Concern   Not on file  Social History Narrative   Not on file   Social Determinants of Health   Financial Resource Strain: Not on file  Food Insecurity: Not on file  Transportation Needs: Not on file  Physical Activity: Not on file  Stress: Not on file  Social Connections: Not on file   No Known Allergies Family History  Problem Relation Age of Onset   Hyperlipidemia Other    Hypertension Other      Current Outpatient Medications (Cardiovascular):    nitroGLYCERIN (NITRO-DUR) 0.2 mg/hr patch, Apply 1/4 of a patch to skin once daily.  Current Outpatient Medications (Respiratory):    cetirizine (ZYRTEC) 10 MG tablet, Take 10 mg by mouth as needed.  Current Outpatient Medications (Analgesics):    meloxicam (MOBIC) 15 MG tablet, Take 1 tablet (15 mg total) by mouth daily.   traMADol (ULTRAM) 50 MG tablet, Take 1 tablet (50 mg total) by mouth every 6 (six) hours as needed.   Current Outpatient Medications (Other):    docusate sodium  (COLACE) 100 MG capsule, Take 1 capsule (100 mg total) by mouth 2 (two) times daily as needed for mild constipation.   Omega-3 Fatty Acids (FISH OIL PO), Take 1-2 capsules by mouth daily after breakfast.   Vitamin D, Ergocalciferol, (DRISDOL) 1.25 MG (50000 UNIT) CAPS capsule, Take 1 capsule (50,000 Units total) by mouth every 7 (seven) days.   valACYclovir (VALTREX) 500 MG tablet, Take 500 mg by mouth at bedtime.   Reviewed prior external information including notes and imaging from  primary care provider As well as notes that were available from care everywhere and other healthcare systems.  Past medical history, social, surgical and family history all reviewed in electronic medical record.  No pertanent information unless stated regarding to the chief complaint.   Review of Systems:  No headache, visual changes, nausea, vomiting, diarrhea, constipation, dizziness, abdominal pain, skin rash, fevers, chills, night sweats, weight loss, swollen lymph nodes, body aches, joint swelling, chest pain, shortness of breath, mood changes. POSITIVE muscle aches  Objective  Blood pressure 130/80, pulse 78, height '5\' 3"'$  (1.6 m), weight 184 lb (83.5 kg), SpO2 99 %.   General: No apparent distress alert and oriented x3 mood and affect normal, dressed appropriately.  HEENT: Pupils equal, extraocular movements intact  Respiratory: Patient's speak in full sentences and does not appear short of breath  Cardiovascular: No lower extremity edema, non tender, no erythema  Right shoulder exam does have a mild positive crossover still noted.  Some pain over the acromion noted still.  Patient now has good grip strength of the right hand.  Limited muscular skeletal ultrasound was performed and interpreted by Hulan Saas, M  Limited ultrasound of patient's right shoulder shows the cortical irregularity noted of the clavicle to have a hard callus formation over it already at this time.  Patient still has a cortical  irregularity noted of the humeral head and does have some chronic changes of the supraspinatus.  No acute changes noted.  Patient though does have hypoechoic changes of the bicep tendon as well as the glenohumeral joint that is consistent with a cyst trace effusion.   Impression and Recommendations:    The above documentation has been reviewed and is accurate and complete Dana Pulley, DO

## 2022-11-01 ENCOUNTER — Ambulatory Visit: Payer: Managed Care, Other (non HMO) | Admitting: Family Medicine

## 2022-11-02 ENCOUNTER — Encounter: Payer: Self-pay | Admitting: Family Medicine

## 2022-11-02 ENCOUNTER — Ambulatory Visit: Payer: Self-pay

## 2022-11-02 ENCOUNTER — Ambulatory Visit (INDEPENDENT_AMBULATORY_CARE_PROVIDER_SITE_OTHER): Payer: Managed Care, Other (non HMO) | Admitting: Family Medicine

## 2022-11-02 VITALS — BP 130/80 | HR 78 | Ht 63.0 in | Wt 184.0 lb

## 2022-11-02 DIAGNOSIS — M25511 Pain in right shoulder: Secondary | ICD-10-CM

## 2022-11-02 NOTE — Assessment & Plan Note (Signed)
Right shoulder

## 2022-11-02 NOTE — Patient Instructions (Signed)
Good to see you  Overall looking good Continue nitro patch unless drinking Does look like improving Lets check back in 6 weeks

## 2022-12-13 NOTE — Progress Notes (Unsigned)
[place] [address] [place] Phone: [phone] Subjective:   IVilma Meckel, am serving as a scribe for Dr. Hulan Saas.  I'm seeing this patient by the request  of:  Fanny Bien, MD  CC: right shoulder pain   [id number]  Domineque Burgi is a 57 y.o. female coming in with complaint of R shoulder pain. Last seen in January 2023. Has been using nitro patches. Doing a lot better. Only pain when active. No new concerns.    Past Medical History:  Diagnosis Date   Appendicitis 01/2022   Cervical stenosis (uterine cervix)    Constipation    Family history of adverse reaction to anesthesia    mother--- ponv   Hematometra    uterine cervix   History of herpes genitalis    History of hypertension    per pt no medication for bp since 2006 , stated lost wt, followed by pcp   Wears contact lenses    Past Surgical History:  Procedure Laterality Date   CESAREAN SECTION     x2 ----  2003  and 02-17-2005 @ [place]   HYSTEROSCOPY N/A 04/07/2022   Procedure: CERVICAL DIALATION;  Surgeon: Louretta Shorten, MD;  Location: [facility];  Service: Gynecology;  Laterality: N/A;   HYSTEROSCOPY W/ ENDOMETRIAL ABLATION  12/15/2009   benign polyp, Dr. Corinna Capra   LAPAROSCOPIC APPENDECTOMY N/A 04/07/2022   Procedure: APPENDECTOMY LAPAROSCOPIC;  Surgeon: Felicie Morn, MD;  Location: [place];  Service: General;  Laterality: N/A;   TOE SURGERY     removal bone spur   Social History   Socioeconomic History   Marital status: Married    Spouse name: Not on file   Number of children: Not on file   Years of education: Not on file   Highest education level: Not on file  Occupational History   Not on file  Tobacco Use   Smoking status: Never   Smokeless tobacco: Never  Vaping Use   Vaping Use: Never used  Substance and Sexual Activity   Alcohol use: Yes    Alcohol/week: 2.0 standard drinks of alcohol     Types: 2 drink(s) per week   Drug use: Never   Sexual activity: Not on file    Comment: per pt no menses since ablation in 2011  Other Topics Concern   Not on file  Social History Narrative   Not on file   Social Determinants of Health   Financial Resource Strain: Not on file  Food Insecurity: Not on file  Transportation Needs: Not on file  Physical Activity: Not on file  Stress: Not on file  Social Connections: Not on file   No Known Allergies Family History  Problem Relation Age of Onset   Hyperlipidemia Other    Hypertension Other      Current Outpatient Medications (Cardiovascular):    nitroGLYCERIN (NITRO-DUR) 0.2 mg/hr patch, Apply 1/4 of a patch to skin once daily.  Current Outpatient Medications (Respiratory):    cetirizine (ZYRTEC) 10 MG tablet, Take 10 mg by mouth as needed.  Current Outpatient Medications (Analgesics):    meloxicam (MOBIC) 15 MG tablet, Take 1 tablet (15 mg total) by mouth daily.   traMADol (ULTRAM) 50 MG tablet, Take 1 tablet (50 mg total) by mouth every 6 (six) hours as needed.   Current Outpatient Medications (Other):    docusate sodium (COLACE) 100 MG capsule, Take 1 capsule (100 mg total) by  mouth 2 (two) times daily as needed for mild constipation.   Omega-3 Fatty Acids (FISH OIL PO), Take 1-2 capsules by mouth daily after breakfast.   valACYclovir (VALTREX) 500 MG tablet, Take 500 mg by mouth at bedtime.   Vitamin D, Ergocalciferol, (DRISDOL) 1.25 MG (50000 UNIT) CAPS capsule, Take 1 capsule (50,000 Units total) by mouth every 7 (seven) days.   Reviewed prior external information including notes and imaging from  primary care provider As well as notes that were available from care everywhere and other healthcare systems.  Past medical history, social, surgical and family history all reviewed in electronic medical record.  No pertanent information unless stated regarding to the chief complaint.     Objective  Blood pressure  120/86, pulse 78, height '5\' 3"'$  (1.6 m), weight 184 lb (83.5 kg), SpO2 96 %.   General: No apparent distress alert and oriented x3 mood and affect normal, dressed appropriately.  HEENT: Pupils equal, extraocular movements intact  Respiratory: Patient's speak in full sentences and does not appear short of breath  Cardiovascular: No lower extremity edema, non tender, no erythema  Right shoulder exam shows patient does still have some positive impingement noted, mild positive Speed and Jurgenson's.  Limited muscular skeletal ultrasound was performed and interpreted by Hulan Saas, M   Limited ultrasound of patient's right shoulder shows there is significant hypoechoic changes within the bicep tendon sheath.  Seems to be mostly inferior to the tendon.  Patient also has an abnormality noted of the anterior labrum.  Still has some increase in neovascularization to the rotator cuff and the subscapularis posteriorly as well as the supraspinatus anteriorly.  No true full-thickness tear appreciated. Impression: Worsening effusion of the bicep tendon    Impression and Recommendations:     The above documentation has been reviewed and is accurate and complete Lyndal Pulley, DO

## 2022-12-14 ENCOUNTER — Ambulatory Visit (INDEPENDENT_AMBULATORY_CARE_PROVIDER_SITE_OTHER): Payer: Managed Care, Other (non HMO) | Admitting: Family Medicine

## 2022-12-14 ENCOUNTER — Encounter: Payer: Self-pay | Admitting: Family Medicine

## 2022-12-14 ENCOUNTER — Ambulatory Visit: Payer: Self-pay

## 2022-12-14 VITALS — BP 120/86 | HR 78 | Ht 63.0 in | Wt 184.0 lb

## 2022-12-14 DIAGNOSIS — M25511 Pain in right shoulder: Secondary | ICD-10-CM | POA: Diagnosis not present

## 2022-12-14 NOTE — Assessment & Plan Note (Signed)
Continues to have some difficulty.  We discussed icing regimen and home exercises, we discussed which activities to do and which ones to avoid.  Increase activity slowly.  Follow-up again in 6 to 8 weeks at that time if continuing to have inflammation would consider the possibility of injection especially bicep tendon sheath

## 2022-12-14 NOTE — Patient Instructions (Addendum)
See you again in 2 Months Arm compression when working out [employer] after activity

## 2023-02-14 NOTE — Progress Notes (Unsigned)
[name] [address] [place] [address] Phone: [phone] Subjective:   [name], am serving as a scribe for Dr. Antoine Primas.  I'm seeing this patient by the request  of:  Lewis Moccasin, MD  CC: Right shoulder pain follow-up  [id number]  12/14/2022 Continues to have some difficulty.  We discussed icing regimen and home exercises, we discussed which activities to do and which ones to avoid.  Increase activity slowly.  Follow-up again in 6 to 8 weeks at that time if continuing to have inflammation would consider the possibility of injection especially bicep tendon sheath      Update 02/15/2023 Saudia Smyser is a 57 y.o. female coming in with complaint of R shoulder pain. Patient states that she still has pain with extension. Does not feel as strong but overall she is feeling less pain.       Past Medical History:  Diagnosis Date   Appendicitis 01/2022   Cervical stenosis (uterine cervix)    Constipation    Family history of adverse reaction to anesthesia    mother--- ponv   Hematometra    uterine cervix   History of herpes genitalis    History of hypertension    per pt no medication for bp since 2006 , stated lost wt, followed by pcp   Wears contact lenses    Past Surgical History:  Procedure Laterality Date   CESAREAN SECTION     x2 ----  2003  and 02-17-2005 @ [facility]   HYSTEROSCOPY N/A 04/07/2022   Procedure: CERVICAL DIALATION;  Surgeon: Candice Camp, MD;  Location: [facility];  Service: Gynecology;  Laterality: N/A;   HYSTEROSCOPY W/ ENDOMETRIAL ABLATION  12/15/2009   benign polyp, Dr. Rana Snare   LAPAROSCOPIC APPENDECTOMY N/A 04/07/2022   Procedure: APPENDECTOMY LAPAROSCOPIC;  Surgeon: Quentin Ore, MD;  Location: [facility];  Service: General;  Laterality: N/A;   TOE SURGERY     removal bone spur   Social History   Socioeconomic History   Marital status: Married    Spouse  name: Not on file   Number of children: Not on file   Years of education: Not on file   Highest education level: Not on file  Occupational History   Not on file  Tobacco Use   Smoking status: Never   Smokeless tobacco: Never  Vaping Use   Vaping Use: Never used  Substance and Sexual Activity   Alcohol use: Yes    Alcohol/week: 2.0 standard drinks of alcohol    Types: 2 drink(s) per week   Drug use: Never   Sexual activity: Not on file    Comment: per pt no menses since ablation in 2011  Other Topics Concern   Not on file  Social History Narrative   Not on file   Social Determinants of Health   Financial Resource Strain: Not on file  Food Insecurity: Not on file  Transportation Needs: Not on file  Physical Activity: Not on file  Stress: Not on file  Social Connections: Not on file   No Known Allergies Family History  Problem Relation Age of Onset   Hyperlipidemia Other    Hypertension Other      Current Outpatient Medications (Cardiovascular):    nitroGLYCERIN (NITRO-DUR) 0.2 mg/hr patch, Apply 1/4 of a patch to skin once daily.  Current Outpatient Medications (Respiratory):    cetirizine (ZYRTEC) 10 MG tablet, Take 10 mg by mouth as needed.  Current Outpatient Medications (Analgesics):    meloxicam (MOBIC) 15 MG tablet, Take 1 tablet (15 mg total) by mouth daily.   traMADol (ULTRAM) 50 MG tablet, Take 1 tablet (50 mg total) by mouth every 6 (six) hours as needed.   Current Outpatient Medications (Other):    Omega-3 Fatty Acids (FISH OIL PO), Take 1-2 capsules by mouth daily after breakfast.   docusate sodium (COLACE) 100 MG capsule, Take 1 capsule (100 mg total) by mouth 2 (two) times daily as needed for mild constipation.   valACYclovir (VALTREX) 500 MG tablet, Take 500 mg by mouth at bedtime.   Vitamin D, Ergocalciferol, (DRISDOL) 1.25 MG (50000 UNIT) CAPS capsule, Take 1 capsule (50,000 Units total) by mouth every 7 (seven) days.   Reviewed prior external  information including notes and imaging from  primary care provider As well as notes that were available from care everywhere and other healthcare systems.  Past medical history, social, surgical and family history all reviewed in electronic medical record.  No pertanent information unless stated regarding to the chief complaint.   Review of Systems:  No headache, visual changes, nausea, vomiting, diarrhea, constipation, dizziness, abdominal pain, skin rash, fevers, chills, night sweats, weight loss, swollen lymph nodes, body aches, joint swelling, chest pain, shortness of breath, mood changes. POSITIVE muscle aches  Objective  Blood pressure 124/76, pulse 86, height 5\' 3"  (1.6 m), weight 183 lb (83 kg), SpO2 98 %.   General: No apparent distress alert and oriented x3 mood and affect normal, dressed appropriately.  HEENT: Pupils equal, extraocular movements intact  Respiratory: Patient's speak in full sentences and does not appear short of breath  Cardiovascular: No lower extremity edema, non tender, no erythema  Right shoulder exam does have a positive impingement noted. Rotator cuff strength 4+ out of 5. Mild positive crossover noted.  Limited muscular skeletal ultrasound was performed and interpreted by Antoine Primas, M  Limited ultrasound shows that patient does still have some hypoechoic changes of the acromioclavicular joint and hypoechoic changes of the bicep tendon that seems to go near the anterior labral aspect.  Patient also has hypoechoic changes that is consistent with a subacromial bursitis. Impression: Improvement but still swelling noted.    Impression and Recommendations:    The above documentation has been reviewed and is accurate and complete Judi Saa, DO

## 2023-02-15 ENCOUNTER — Ambulatory Visit: Payer: Managed Care, Other (non HMO) | Admitting: Family Medicine

## 2023-02-15 ENCOUNTER — Encounter: Payer: Self-pay | Admitting: Family Medicine

## 2023-02-15 ENCOUNTER — Ambulatory Visit: Payer: Self-pay

## 2023-02-15 VITALS — BP 124/76 | HR 86 | Ht 63.0 in | Wt 183.0 lb

## 2023-02-15 DIAGNOSIS — M25511 Pain in right shoulder: Secondary | ICD-10-CM

## 2023-02-15 NOTE — Patient Instructions (Signed)
Will give it more time Keep doing exercises See me in 2 months

## 2023-02-15 NOTE — Assessment & Plan Note (Signed)
Seems like patient is doing significantly better.  Still on ultrasound has some hypoechoic changes consistent with a bursitis as well as some mild effusion noted of the acromioclavicular joint.  Patient at this point would like to continue to work on conservative therapy including the home exercises and icing regimen.  Patient will follow-up with me again in 6 weeks.  At that time if continuing to have any type of pain or swelling we will consider injections.

## 2023-04-06 ENCOUNTER — Ambulatory Visit (INDEPENDENT_AMBULATORY_CARE_PROVIDER_SITE_OTHER): Payer: Managed Care, Other (non HMO)

## 2023-04-06 ENCOUNTER — Encounter: Payer: Self-pay | Admitting: Podiatry

## 2023-04-06 ENCOUNTER — Ambulatory Visit (INDEPENDENT_AMBULATORY_CARE_PROVIDER_SITE_OTHER): Payer: Managed Care, Other (non HMO) | Admitting: Podiatry

## 2023-04-06 DIAGNOSIS — M779 Enthesopathy, unspecified: Secondary | ICD-10-CM | POA: Diagnosis not present

## 2023-04-06 DIAGNOSIS — M7671 Peroneal tendinitis, right leg: Secondary | ICD-10-CM | POA: Diagnosis not present

## 2023-04-06 DIAGNOSIS — M7672 Peroneal tendinitis, left leg: Secondary | ICD-10-CM | POA: Diagnosis not present

## 2023-04-06 MED ORDER — TRIAMCINOLONE ACETONIDE 10 MG/ML IJ SUSP
20.0000 mg | Freq: Once | INTRAMUSCULAR | Status: AC
  Administered 2023-04-06: 20 mg

## 2023-04-07 NOTE — Progress Notes (Signed)
Subjective:   Patient ID: Dana [name], female   DOB: 57 y.o.   MRN: [id number]   HPI Patient states she is developed a lot of pain in the outside of both feet and the right is worse than the left.  It is worse when she gets up in the morning and she is going to be working full-time at [employer] and has trouble.  Weight.  Patient does not smoke and likes to be active   Review of Systems  All other systems reviewed and are negative.       Objective:  Physical Exam Vitals and nursing note reviewed.  Constitutional:      Appearance: She is well-developed.  Pulmonary:     Effort: Pulmonary effort is normal.  Musculoskeletal:        General: Normal range of motion.  Skin:    General: Skin is warm.  Neurological:     Mental Status: She is alert.     Neurovascular status intact muscle strength found to be adequate range of motion adequate inflammation around the base of the fifth metatarsal both feet fluid buildup right over left.  Patient is noted to have good digital perfusion well oriented moderate arch pain but the pain is worse on the outside of the feet.     Assessment:  Peroneal tendinitis bilateral right over left with inflammation fluid buildup     Plan:  H&P reviewed and at this point I am going to treat conservatively after reviewing x-rays I did do sterile prep I injected the sheath of the peroneal bilateral 3 mg dexamethasone Kenalog 5 mg Xylocaine and advised on supportive shoes anti-inflammatories.  Patient will be seen back may require other treatments depending on response to conservative treatment  X-rays taken today were negative for signs that there is any bony injury around the area or any indications of chronic arthritis

## 2023-04-10 ENCOUNTER — Encounter: Payer: Self-pay | Admitting: Podiatry

## 2023-04-13 ENCOUNTER — Other Ambulatory Visit: Payer: Self-pay | Admitting: Podiatry

## 2023-04-14 ENCOUNTER — Telehealth: Payer: Self-pay | Admitting: Podiatry

## 2023-04-14 NOTE — Progress Notes (Unsigned)
[name] [address] [place] [address] Phone: [phone] Subjective:   [name], am serving as a scribe for Dr. Antoine Primas.  I'm seeing this patient by the request  of:  Lewis Moccasin, MD  CC: Right shoulder pain follow-up  [id number]  02/15/2023 Seems like patient is doing significantly better.  Still on ultrasound has some hypoechoic changes consistent with a bursitis as well as some mild effusion noted of the acromioclavicular joint.  Patient at this point would like to continue to work on conservative therapy including the home exercises and icing regimen.  Patient will follow-up with me again in 6 weeks.  At that time if continuing to have any type of pain or swelling we will consider injections.      Update 04/17/2023 Dana [name] is a 57 y.o. female coming in with complaint of R shoulder pain. Patient states swimming and tweaked it while treading water. But feeling great today.  Patient states that she feels approximately 95% better.       Past Medical History:  Diagnosis Date   Appendicitis 01/2022   Cervical stenosis (uterine cervix)    Constipation    Family history of adverse reaction to anesthesia    mother--- ponv   Hematometra    uterine cervix   History of herpes genitalis    History of hypertension    per pt no medication for bp since 2006 , stated lost wt, followed by pcp   Wears contact lenses    Past Surgical History:  Procedure Laterality Date   CESAREAN SECTION     x2 ----  2003  and 02-17-2005 @ [facility]   HYSTEROSCOPY N/A 04/07/2022   Procedure: CERVICAL DIALATION;  Surgeon: Candice Camp, MD;  Location: [facility];  Service: Gynecology;  Laterality: N/A;   HYSTEROSCOPY W/ ENDOMETRIAL ABLATION  12/15/2009   benign polyp, Dr. Rana Snare   LAPAROSCOPIC APPENDECTOMY N/A 04/07/2022   Procedure: APPENDECTOMY LAPAROSCOPIC;  Surgeon: Quentin Ore, MD;  Location: [facility];   Service: General;  Laterality: N/A;   TOE SURGERY     removal bone spur   Social History   Socioeconomic History   Marital status: Married    Spouse name: Not on file   Number of children: Not on file   Years of education: Not on file   Highest education level: Not on file  Occupational History   Not on file  Tobacco Use   Smoking status: Never   Smokeless tobacco: Never  Vaping Use   Vaping Use: Never used  Substance and Sexual Activity   Alcohol use: Yes    Alcohol/week: 2.0 standard drinks of alcohol    Types: 2 drink(s) per week   Drug use: Never   Sexual activity: Not on file    Comment: per pt no menses since ablation in 2011  Other Topics Concern   Not on file  Social History Narrative   Not on file   Social Determinants of Health   Financial Resource Strain: Not on file  Food Insecurity: Not on file  Transportation Needs: Not on file  Physical Activity: Not on file  Stress: Not on file  Social Connections: Not on file   No Known Allergies Family History  Problem Relation Age of Onset   Hyperlipidemia Other    Hypertension Other        Current Outpatient Medications (Analgesics):    meloxicam (MOBIC) 15 MG tablet, Take  1 tablet (15 mg total) by mouth daily.   Current Outpatient Medications (Other):    Omega-3 Fatty Acids (FISH OIL PO), Take 1-2 capsules by mouth daily after breakfast.   Reviewed prior external information including notes and imaging from  primary care provider As well as notes that were available from care everywhere and other healthcare systems.  Past medical history, social, surgical and family history all reviewed in electronic medical record.  No pertanent information unless stated regarding to the chief complaint.   Review of Systems:  No headache, visual changes, nausea, vomiting, diarrhea, constipation, dizziness, abdominal pain, skin rash, fevers, chills, night sweats, weight loss, swollen lymph nodes, body aches, joint  swelling, chest pain, shortness of breath, mood changes. POSITIVE muscle aches  Objective  Blood pressure 118/76, pulse 70, height 5\' 3"  (1.6 m), weight 185 lb (83.9 kg), SpO2 97 %.   General: No apparent distress alert and oriented x3 mood and affect normal, dressed appropriately.  HEENT: Pupils equal, extraocular movements intact  Respiratory: Patient's speak in full sentences and does not appear short of breath  Cardiovascular: No lower extremity edema, non tender, no erythema  Right shoulder exam does have improvement in range of motion noted.  Still has some discomfort though noted with empty can.  4+ out of 5 strength compared to 5 out of 5 strength on the contralateral side.  Limited muscular skeletal ultrasound was performed and interpreted by Antoine Primas, M  Limited ultrasound shows significant decrease in the hypoechoic changes of the acromioclavicular joint.  Still some mild hypoechoic changes noted in the bicep tendon sheath anteriorly.  Patient does have an abnormality noted of the supraspinatus with hyperechoic changes consistent with more scar tissue formation at the moment.  This does show interval improvement. Impression: Overall improvement of the shoulder pathology    Impression and Recommendations:

## 2023-04-14 NOTE — Telephone Encounter (Signed)
Pt is checking on the status of her Rx. Pt stated the pharmacy said it wasn't called in yet.   Please advise

## 2023-04-17 ENCOUNTER — Ambulatory Visit (INDEPENDENT_AMBULATORY_CARE_PROVIDER_SITE_OTHER): Payer: Managed Care, Other (non HMO) | Admitting: Family Medicine

## 2023-04-17 ENCOUNTER — Other Ambulatory Visit: Payer: Self-pay

## 2023-04-17 ENCOUNTER — Encounter: Payer: Self-pay | Admitting: Family Medicine

## 2023-04-17 VITALS — BP 118/76 | HR 70 | Ht 63.0 in | Wt 185.0 lb

## 2023-04-17 DIAGNOSIS — M25511 Pain in right shoulder: Secondary | ICD-10-CM

## 2023-04-17 MED ORDER — MELOXICAM 15 MG PO TABS: 15.0000 mg | ORAL_TABLET | Freq: Every day | ORAL | 0 refills | Status: DC

## 2023-04-17 NOTE — Assessment & Plan Note (Signed)
Improvement noted.  Continue to work on home exercises, posture and ergonomics and follow-up with 3 months

## 2023-04-17 NOTE — Patient Instructions (Addendum)
Do prescribed exercises at least 3x a week Heel lift: 1/8th inch Refilled meloxicam See you again in 3 months

## 2023-06-28 ENCOUNTER — Other Ambulatory Visit: Payer: 59

## 2023-06-28 ENCOUNTER — Ambulatory Visit (INDEPENDENT_AMBULATORY_CARE_PROVIDER_SITE_OTHER): Payer: 59 | Admitting: Podiatry

## 2023-06-28 ENCOUNTER — Encounter: Payer: Self-pay | Admitting: Podiatry

## 2023-06-28 DIAGNOSIS — M7671 Peroneal tendinitis, right leg: Secondary | ICD-10-CM | POA: Diagnosis not present

## 2023-06-28 DIAGNOSIS — M7672 Peroneal tendinitis, left leg: Secondary | ICD-10-CM | POA: Diagnosis not present

## 2023-06-28 MED ORDER — TRIAMCINOLONE ACETONIDE 10 MG/ML IJ SUSP
10.0000 mg | Freq: Once | INTRAMUSCULAR | Status: AC
  Administered 2023-06-28: 10 mg via INTRA_ARTICULAR

## 2023-06-28 NOTE — Progress Notes (Signed)
Subjective:   Patient ID: Dana [name], female   DOB: 57 y.o.   MRN: [id number]   HPI Patient states she has been getting pain again in her outside of the feet but states the left is worse than the right now it has been inflamed and sore and she did do well with it for a couple months   ROS      Objective:  Physical Exam  Neuro vas status intact with inflammation which has occurred again in the peroneal insertion base of fifth metatarsal left over right with this now being of approximate 67-month duration with improvement for a month or 2     Assessment:  Dana [name] peroneal tendinitis at insertion base of fifth metatarsal left over right H&P     Plan:  Discussed foot structure and I have recommended orthotics to valgus wedge her feet and take pressure off this and she is casted by pedorthist I did go ahead today I reinjected the left after explaining risk and I immobilized with air fracture walker with instructions on ice therapy and reappoint to recheck

## 2023-07-13 NOTE — Progress Notes (Deleted)
[name] [address] [place] [address] Phone: [phone] Subjective:    I'm seeing this patient by the request  of:  Lewis Moccasin, MD  CC:   [id number]  04/17/2023 Improvement noted. Continue to work on home exercises, posture and ergonomics and follow-up with 3 months   Update 07/18/2023 Dana [name] is a 57 y.o. female coming in with complaint of R shoulder pain. Patient states        Past Medical History:  Diagnosis Date   Appendicitis 01/2022   Cervical stenosis (uterine cervix)    Constipation    Family history of adverse reaction to anesthesia    mother--- ponv   Hematometra    uterine cervix   History of herpes genitalis    History of hypertension    per pt no medication for bp since 2006 , stated lost wt, followed by pcp   Wears contact lenses    Past Surgical History:  Procedure Laterality Date   CESAREAN SECTION     x2 ----  2003  and 02-17-2005 @ [facility]   HYSTEROSCOPY N/A 04/07/2022   Procedure: CERVICAL DIALATION;  Surgeon: Candice Camp, MD;  Location: [facility];  Service: Gynecology;  Laterality: N/A;   HYSTEROSCOPY W/ ENDOMETRIAL ABLATION  12/15/2009   benign polyp, Dr. Rana Snare   LAPAROSCOPIC APPENDECTOMY N/A 04/07/2022   Procedure: APPENDECTOMY LAPAROSCOPIC;  Surgeon: Quentin Ore, MD;  [facility];  Service: General;  Laterality: N/A;   TOE SURGERY     removal bone spur   Social History   Socioeconomic History   Marital status: Married    Spouse name: Not on file   Number of children: Not on file   Years of education: Not on file   Highest education level: Not on file  Occupational History   Not on file  Tobacco Use   Smoking status: Never   Smokeless tobacco: Never  Vaping Use   Vaping status: Never Used  Substance and Sexual Activity   Alcohol use: Yes    Alcohol/week: 2.0 standard drinks of alcohol    Types: 2 drink(s) per week   Drug use:  Never   Sexual activity: Not on file    Comment: per pt no menses since ablation in 2011  Other Topics Concern   Not on file  Social History Narrative   Not on file   Social Determinants of Health   Financial Resource Strain: Low Risk  (06/26/2023)   Received from [facility]   Overall Financial Resource Strain (CARDIA)    Difficulty of Paying Living Expenses: Not very hard  Food Insecurity: No Food Insecurity (06/26/2023)   Received from [facility]   Hunger Vital Sign    Worried About Running Out of Food in the Last Year: Never true    Ran Out of Food in the Last Year: Never true  Transportation Needs: No Transportation Needs (06/26/2023)   Received from [facility] - Transportation    Lack of Transportation (Medical): No    Lack of Transportation (Non-Medical): No  Physical Activity: Sufficiently Active (06/26/2023)   Received from [facility]   Exercise Vital Sign    Days of Exercise per Week: 5 days    Minutes of Exercise per Session: 60 min  Stress: No Stress Concern Present (06/26/2023)   Received from [facility] - Occupational Stress Questionnaire    Feeling of  Stress : Only a little  Social Connections: Socially Integrated (06/26/2023)   Received from [facility]   Social Network    How would you rate your social network (family, work, friends)?: Good participation with social networks   No Known Allergies Family History  Problem Relation Age of Onset   Hyperlipidemia Other    Hypertension Other        Current Outpatient Medications (Analgesics):    meloxicam (MOBIC) 15 MG tablet, Take 1 tablet (15 mg total) by mouth daily.   Current Outpatient Medications (Other):    Omega-3 Fatty Acids (FISH OIL PO), Take 1-2 capsules by mouth daily after breakfast.   Reviewed prior external information including notes and imaging from  primary care provider As well as notes that were available from care  everywhere and other healthcare systems.  Past medical history, social, surgical and family history all reviewed in electronic medical record.  No pertanent information unless stated regarding to the chief complaint.   Review of Systems:  No headache, visual changes, nausea, vomiting, diarrhea, constipation, dizziness, abdominal pain, skin rash, fevers, chills, night sweats, weight loss, swollen lymph nodes, body aches, joint swelling, chest pain, shortness of breath, mood changes. POSITIVE muscle aches  Objective  There were no vitals taken for this visit.   General: No apparent distress alert and oriented x3 mood and affect normal, dressed appropriately.  HEENT: Pupils equal, extraocular movements intact  Respiratory: Patient's speak in full sentences and does not appear short of breath  Cardiovascular: No lower extremity edema, non tender, no erythema      Impression and Recommendations:

## 2023-07-18 ENCOUNTER — Ambulatory Visit: Payer: 59 | Admitting: Family Medicine

## 2023-07-18 NOTE — Progress Notes (Unsigned)
[name] [address] [place] [address] Phone: [phone] Subjective:   Dana [name], am serving as a scribe for Dr. Antoine [name].  I'm seeing this patient by the request  of:  Lewis Moccasin, MD  CC: right shoulder pain f/u   [id number]  04/17/2023 Improvement noted.  Continue to work on home exercises, posture and ergonomics and follow-up with 3 months     Update 07/19/2023 Dana [name] is a 57 y.o. female coming in with complaint of R shoulder pain. Patient states doing a bit worse since last visit. Swam for the first time in a while. Shoulder hasn't been the same since.     Past Medical History:  Diagnosis Date   Appendicitis 01/2022   Cervical stenosis (uterine cervix)    Constipation    Family history of adverse reaction to anesthesia    mother--- ponv   Hematometra    uterine cervix   History of herpes genitalis    History of hypertension    per pt no medication for bp since 2006 , stated lost wt, followed by pcp   Wears contact lenses    Past Surgical History:  Procedure Laterality Date   CESAREAN SECTION     x2 ----  2003  and 02-17-2005 @ [facility]   HYSTEROSCOPY N/A 04/07/2022   Procedure: CERVICAL DIALATION;  Surgeon: Candice Camp, MD;  Location: [facility];  Service: Gynecology;  Laterality: N/A;   HYSTEROSCOPY W/ ENDOMETRIAL ABLATION  12/15/2009   benign polyp, Dr. Rana Snare   LAPAROSCOPIC APPENDECTOMY N/A 04/07/2022   Procedure: APPENDECTOMY LAPAROSCOPIC;  Surgeon: Quentin Ore, MD;  Location: [facility];  Service: General;  Laterality: N/A;   TOE SURGERY     removal bone spur   Social History   Socioeconomic History   Marital status: Married    Spouse name: Not on file   Number of children: Not on file   Years of education: Not on file   Highest education level: Not on file  Occupational History   Not on file  Tobacco Use   Smoking status: Never   Smokeless tobacco:  Never  Vaping Use   Vaping status: Never Used  Substance and Sexual Activity   Alcohol use: Yes    Alcohol/week: 2.0 standard drinks of alcohol    Types: 2 drink(s) per week   Drug use: Never   Sexual activity: Not on file    Comment: per pt no menses since ablation in 2011  Other Topics Concern   Not on file  Social History Narrative   Not on file   Social Determinants of Health   Financial Resource Strain: Low Risk  (06/26/2023)   Received from [facility]   Overall Financial Resource Strain (CARDIA)    Difficulty of Paying Living Expenses: Not very hard  Food Insecurity: No Food Insecurity (06/26/2023)   Received from [facility]   Hunger Vital Sign    Worried About Running Out of Food in the Last Year: Never true    Ran Out of Food in the Last Year: Never true  Transportation Needs: No Transportation Needs (06/26/2023)   Received from [facility] - Transportation    Lack of Transportation (Medical): No    Lack of Transportation (Non-Medical): No  Physical Activity: Sufficiently Active (06/26/2023)   Received from [facility]   Exercise Vital Sign    Days of Exercise per Week: 5 days  Minutes of Exercise per Session: 60 min  Stress: No Stress Concern Present (06/26/2023)   Received from [facility] - Occupational Stress Questionnaire    Feeling of Stress : Only a little  Social Connections: Socially Integrated (06/26/2023)   Received from [facility]   Social Network    How would you rate your social network (family, work, friends)?: Good participation with social networks   No Known Allergies Family History  Problem Relation Age of Onset   Hyperlipidemia Other    Hypertension Other        Current Outpatient Medications (Analgesics):    meloxicam (MOBIC) 15 MG tablet, Take 1 tablet (15 mg total) by mouth daily.   Current Outpatient Medications (Other):    Omega-3 Fatty Acids (FISH OIL PO), Take 1-2  capsules by mouth daily after breakfast.   Reviewed prior external information including notes and imaging from  primary care provider As well as notes that were available from care everywhere and other healthcare systems.  Past medical history, social, surgical and family history all reviewed in electronic medical record.  No pertanent information unless stated regarding to the chief complaint.   Review of Systems:  No headache, visual changes, nausea, vomiting, diarrhea, constipation, dizziness, abdominal pain, skin rash, fevers, chills, night sweats, weight loss, swollen lymph nodes, body aches, joint swelling, chest pain, shortness of breath, mood changes. POSITIVE muscle aches  Objective  There were no vitals taken for this visit.   General: No apparent distress alert and oriented x3 mood and affect normal, dressed appropriately.  HEENT: Pupils equal, extraocular movements intact  Respiratory: Patient's speak in full sentences and does not appear short of breath  Cardiovascular: No lower extremity edema, non tender, no erythema  Right shoulder exam shows positive O'Brien's noted.  Rotator cuff strength does appear to be intact.  Mild positive Speed and Jurgenson's  Limited muscular skeletal ultrasound was performed and interpreted by Dana [name], M   Limited ultrasound shows hypoechoic changes in the bicep tendon at the attachment of the anterior labrum.  Patient also has what appears to be a reactive bursitis with hypoechoic changes at supraspinatus.  Questionable small tearing noted of the subscapularis but no significant retraction noted.      Impression and Recommendations:    The above documentation has been reviewed and is accurate and complete Dana Saa, DO

## 2023-07-19 ENCOUNTER — Other Ambulatory Visit (HOSPITAL_BASED_OUTPATIENT_CLINIC_OR_DEPARTMENT_OTHER): Payer: Self-pay

## 2023-07-19 ENCOUNTER — Other Ambulatory Visit: Payer: Self-pay

## 2023-07-19 ENCOUNTER — Ambulatory Visit (INDEPENDENT_AMBULATORY_CARE_PROVIDER_SITE_OTHER): Payer: 59 | Admitting: Family Medicine

## 2023-07-19 ENCOUNTER — Ambulatory Visit: Payer: 59

## 2023-07-19 ENCOUNTER — Encounter: Payer: Self-pay | Admitting: Family Medicine

## 2023-07-19 VITALS — BP 124/82 | HR 77 | Ht 63.0 in | Wt 190.0 lb

## 2023-07-19 DIAGNOSIS — M25511 Pain in right shoulder: Secondary | ICD-10-CM | POA: Diagnosis not present

## 2023-07-19 MED ORDER — MELOXICAM 15 MG PO TABS
15.0000 mg | ORAL_TABLET | Freq: Every day | ORAL | 0 refills | Status: DC
  Filled 2023-07-19: qty 30, 30d supply, fill #0

## 2023-07-19 NOTE — Patient Instructions (Signed)
Meloxicam refilled Daily for 5 days then as needed in 3-5 day burst See you again in 6-8 weeks if not perfect

## 2023-07-19 NOTE — Assessment & Plan Note (Signed)
Right shoulder does show some labral pathology noted.  Patient does have some hypoechoic changes of the anterior bicep tendon and labrum that is concerning.  Discussed with patient about icing regimen and home exercises, which activities to do and which ones to avoid.  Increase activity slowly, increase certain range of motion and stability exercises.  Follow-up again in 6 to 8 weeks.  Refilled meloxicam for the worsening pain.

## 2023-07-19 NOTE — Progress Notes (Signed)
Patient presents today to pick up custom molded foot orthotics, diagnosed with Peroneal Tendonitis by Dr. Charlsie Merles .   Orthotics were dispensed and fit was satisfactory. Reviewed instructions for break-in and wear. Written instructions given to patient.  Patient will follow up as needed.   Addison Bailey Cped, CFo, CFm

## 2023-07-22 ENCOUNTER — Other Ambulatory Visit (HOSPITAL_BASED_OUTPATIENT_CLINIC_OR_DEPARTMENT_OTHER): Payer: Self-pay

## 2023-08-01 ENCOUNTER — Other Ambulatory Visit (HOSPITAL_BASED_OUTPATIENT_CLINIC_OR_DEPARTMENT_OTHER): Payer: Self-pay

## 2023-08-01 MED ORDER — FLULAVAL 0.5 ML IM SUSY
0.5000 mL | PREFILLED_SYRINGE | Freq: Once | INTRAMUSCULAR | 0 refills | Status: AC
  Filled 2023-08-01: qty 0.5, 1d supply, fill #0

## 2023-08-30 ENCOUNTER — Ambulatory Visit: Payer: 59 | Admitting: Family Medicine

## 2023-09-27 NOTE — Progress Notes (Unsigned)
[name] [address] [place] [address] Phone: [phone] Subjective:   Dana [name], am serving as a scribe for Dr. Antoine Primas.  I'm seeing this patient by the request  of:  Beam, Chales Salmon, MD  CC: Right shoulder pain  PIR:[id number]  07/19/2023 Right shoulder does show some labral pathology noted.  Patient does have some hypoechoic changes of the anterior bicep tendon and labrum that is concerning.  Discussed with patient about icing regimen and home exercises, which activities to do and which ones to avoid.  Increase activity slowly, increase certain range of motion and stability exercises.  Follow-up again in 6 to 8 weeks.  Refilled meloxicam for the worsening pain.      Update 09/28/2023 Dana [name] is a 57 y.o. female coming in with complaint of R shoulder pain. Patient states that she is doing a lot better. Painful only when flexing shoulder.  Patient would state that she is 95% better.  Only notices it with certain movements.  Nothing that stopping her from activity.  Have noticed improvement with sleeping position as well.      Past Medical History:  Diagnosis Date   Appendicitis 01/2022   Cervical stenosis (uterine cervix)    Constipation    Family history of adverse reaction to anesthesia    mother--- ponv   Hematometra    uterine cervix   History of herpes genitalis    History of hypertension    per pt no medication for bp since 2006 , stated lost wt, followed by pcp   Wears contact lenses    Past Surgical History:  Procedure Laterality Date   CESAREAN SECTION     x2 ----  2003  and 02-17-2005 @ [facility]   HYSTEROSCOPY N/A 04/07/2022   Procedure: CERVICAL DIALATION;  Surgeon: Candice Camp, MD;  Location: [facility];  Service: Gynecology;  Laterality: N/A;   HYSTEROSCOPY W/ ENDOMETRIAL ABLATION  12/15/2009   benign polyp, Dr. Rana Snare   LAPAROSCOPIC APPENDECTOMY N/A 04/07/2022   Procedure: APPENDECTOMY  LAPAROSCOPIC;  Surgeon: Quentin Ore, MD;  Location: [facility];  Service: General;  Laterality: N/A;   TOE SURGERY     removal bone spur   Social History   Socioeconomic History   Marital status: Married    Spouse name: Not on file   Number of children: Not on file   Years of education: Not on file   Highest education level: Not on file  Occupational History   Not on file  Tobacco Use   Smoking status: Never   Smokeless tobacco: Never  Vaping Use   Vaping status: Never Used  Substance and Sexual Activity   Alcohol use: Yes    Alcohol/week: 2.0 standard drinks of alcohol    Types: 2 drink(s) per week   Drug use: Never   Sexual activity: Not on file    Comment: per pt no menses since ablation in 2011  Other Topics Concern   Not on file  Social History Narrative   Not on file   Social Drivers of Health   Financial Resource Strain: Low Risk  (06/26/2023)   Received from [facility]   Overall Financial Resource Strain (CARDIA)    Difficulty of Paying Living Expenses: Not very hard  Food Insecurity: No Food Insecurity (06/26/2023)   Received from [facility]   Hunger Vital Sign    Worried About Running Out of Food in the Last Year: Never  true    Ran Out of Food in the Last Year: Never true  Transportation Needs: No Transportation Needs (06/26/2023)   Received from [facility] - Transportation    Lack of Transportation (Medical): No    Lack of Transportation (Non-Medical): No  Physical Activity: Sufficiently Active (06/26/2023)   Received from [facility]   Exercise Vital Sign    Days of Exercise per Week: 5 days    Minutes of Exercise per Session: 60 min  Stress: No Stress Concern Present (06/26/2023)   Received from [facility] - Occupational Stress Questionnaire    Feeling of Stress : Only a little  Social Connections: Socially Integrated (06/26/2023)   Received from [facility]    Social Network    How would you rate your social network (family, work, friends)?: Good participation with social networks   No Known Allergies Family History  Problem Relation Age of Onset   Hyperlipidemia Other    Hypertension Other        Current Outpatient Medications (Analgesics):    meloxicam (MOBIC) 15 MG tablet, Take 1 tablet (15 mg total) by mouth daily.   Current Outpatient Medications (Other):    Omega-3 Fatty Acids (FISH OIL PO), Take 1-2 capsules by mouth daily after breakfast.   Reviewed prior external information including notes and imaging from  primary care provider As well as notes that were available from care everywhere and other healthcare systems.  Past medical history, social, surgical and family history all reviewed in electronic medical record.  No pertanent information unless stated regarding to the chief complaint.   Review of Systems:  No headache, visual changes, nausea, vomiting, diarrhea, constipation, dizziness, abdominal pain, skin rash, fevers, chills, night sweats, weight loss, swollen lymph nodes, body aches, joint swelling, chest pain, shortness of breath, mood changes. POSITIVE muscle aches  Objective  Blood pressure 110/86, pulse 74, height 5\' 3"  (1.6 m), weight 189 lb (85.7 kg), SpO2 99%.   General: No apparent distress alert and oriented x3 mood and affect normal, dressed appropriately.  HEENT: Pupils equal, extraocular movements intact  Respiratory: Patient's speak in full sentences and does not appear short of breath  Cardiovascular: No lower extremity edema, non tender, no erythema  Patient's right shoulder does have improvement in range of motion noted.  Still mild positive O'Brien's noted.  Otherwise rotator cuff strength 5 out of 5  Limited muscular skeletal ultrasound was performed and interpreted by Antoine Primas, M  Limited ultrasound shows the patient does have very mild hypoechoic changes of the acromioclavicular joint.   Mild hypoechoic changes over the supraspinatus but no true tearing appreciated.  Still some mild abnormality noted of the posterior labrum but no significant displacement or joint effusion noted. Impression: Interval improvement    Impression and Recommendations:    The above documentation has been reviewed and is accurate and complete Judi Saa, DO

## 2023-09-28 ENCOUNTER — Encounter: Payer: Self-pay | Admitting: Family Medicine

## 2023-09-28 ENCOUNTER — Other Ambulatory Visit (HOSPITAL_BASED_OUTPATIENT_CLINIC_OR_DEPARTMENT_OTHER): Payer: Self-pay

## 2023-09-28 ENCOUNTER — Other Ambulatory Visit: Payer: Self-pay

## 2023-09-28 ENCOUNTER — Ambulatory Visit: Payer: 59 | Admitting: Family Medicine

## 2023-09-28 VITALS — BP 110/86 | HR 74 | Ht 63.0 in | Wt 189.0 lb

## 2023-09-28 DIAGNOSIS — M25511 Pain in right shoulder: Secondary | ICD-10-CM | POA: Diagnosis not present

## 2023-09-28 MED ORDER — MELOXICAM 15 MG PO TABS
15.0000 mg | ORAL_TABLET | Freq: Every day | ORAL | 0 refills | Status: DC
  Filled 2023-09-28: qty 90, 90d supply, fill #0

## 2023-09-28 NOTE — Patient Instructions (Signed)
Meloxicam is refilled See you when you need [place]

## 2023-09-28 NOTE — Assessment & Plan Note (Signed)
Patient's right shoulder likely still labral pathology but significantly improved at this point.  We discussed icing regimen of home exercises refilled meloxicam to have it when needed.  At this point patient states 95% or better at the moment.  Follow-up with me as needed

## 2023-10-03 ENCOUNTER — Other Ambulatory Visit (HOSPITAL_BASED_OUTPATIENT_CLINIC_OR_DEPARTMENT_OTHER): Payer: Self-pay

## 2023-10-03 MED ORDER — METRONIDAZOLE 0.75 % EX CREA
1.0000 | TOPICAL_CREAM | Freq: Two times a day (BID) | CUTANEOUS | 0 refills | Status: DC
  Filled 2023-10-03: qty 45, 30d supply, fill #0

## 2023-10-24 ENCOUNTER — Other Ambulatory Visit (HOSPITAL_BASED_OUTPATIENT_CLINIC_OR_DEPARTMENT_OTHER): Payer: Self-pay

## 2023-10-24 MED ORDER — DOXYCYCLINE HYCLATE 100 MG PO CAPS
100.0000 mg | ORAL_CAPSULE | Freq: Every day | ORAL | 1 refills | Status: AC
  Filled 2023-10-24 (×2): qty 30, 30d supply, fill #0
  Filled 2023-11-13: qty 30, 30d supply, fill #1

## 2023-10-27 ENCOUNTER — Other Ambulatory Visit (HOSPITAL_BASED_OUTPATIENT_CLINIC_OR_DEPARTMENT_OTHER): Payer: Self-pay

## 2023-10-27 MED ORDER — PROGESTERONE MICRONIZED 100 MG PO CAPS
100.0000 mg | ORAL_CAPSULE | Freq: Every day | ORAL | 4 refills | Status: AC
  Filled 2023-10-27: qty 90, 90d supply, fill #0
  Filled 2024-02-08: qty 90, 90d supply, fill #1
  Filled 2024-04-16 – 2024-04-29 (×2): qty 90, 90d supply, fill #2

## 2023-10-27 MED ORDER — ESTRADIOL 2 MG PO TABS
2.0000 mg | ORAL_TABLET | Freq: Every day | ORAL | 4 refills | Status: AC
  Filled 2023-10-27: qty 90, 90d supply, fill #0
  Filled 2024-02-08: qty 90, 90d supply, fill #1
  Filled 2024-04-16 – 2024-04-29 (×2): qty 90, 90d supply, fill #2
  Filled ????-??-??: fill #2

## 2023-10-31 ENCOUNTER — Other Ambulatory Visit (HOSPITAL_BASED_OUTPATIENT_CLINIC_OR_DEPARTMENT_OTHER): Payer: Self-pay

## 2023-11-13 ENCOUNTER — Other Ambulatory Visit (HOSPITAL_BASED_OUTPATIENT_CLINIC_OR_DEPARTMENT_OTHER): Payer: Self-pay

## 2023-11-28 ENCOUNTER — Other Ambulatory Visit (HOSPITAL_BASED_OUTPATIENT_CLINIC_OR_DEPARTMENT_OTHER): Payer: Self-pay

## 2023-11-28 MED ORDER — AZELAIC ACID 15 % EX GEL
1.0000 | Freq: Two times a day (BID) | CUTANEOUS | 3 refills | Status: AC
  Filled 2023-11-28: qty 50, 30d supply, fill #0
  Filled 2024-06-29: qty 50, 30d supply, fill #1

## 2023-12-14 ENCOUNTER — Other Ambulatory Visit (HOSPITAL_BASED_OUTPATIENT_CLINIC_OR_DEPARTMENT_OTHER): Payer: Self-pay

## 2023-12-14 MED ORDER — METRONIDAZOLE 0.75 % EX CREA
1.0000 | TOPICAL_CREAM | Freq: Two times a day (BID) | CUTANEOUS | 2 refills | Status: AC
  Filled 2023-12-14: qty 45, 30d supply, fill #0

## 2024-02-08 ENCOUNTER — Other Ambulatory Visit (HOSPITAL_BASED_OUTPATIENT_CLINIC_OR_DEPARTMENT_OTHER): Payer: Self-pay

## 2024-02-08 MED ORDER — TRETINOIN 0.025 % EX CREA
TOPICAL_CREAM | CUTANEOUS | 2 refills | Status: AC
Start: 2024-02-08 — End: ?
  Filled 2024-02-08: qty 20, 30d supply, fill #0

## 2024-03-22 ENCOUNTER — Other Ambulatory Visit: Payer: Self-pay | Admitting: Family Medicine

## 2024-03-22 ENCOUNTER — Other Ambulatory Visit (HOSPITAL_BASED_OUTPATIENT_CLINIC_OR_DEPARTMENT_OTHER): Payer: Self-pay

## 2024-03-22 MED ORDER — MELOXICAM 15 MG PO TABS
15.0000 mg | ORAL_TABLET | Freq: Every day | ORAL | 0 refills | Status: AC
  Filled 2024-03-22: qty 30, 30d supply, fill #0

## 2024-04-02 ENCOUNTER — Other Ambulatory Visit (HOSPITAL_BASED_OUTPATIENT_CLINIC_OR_DEPARTMENT_OTHER): Payer: Self-pay

## 2024-04-08 NOTE — Progress Notes (Unsigned)
 [name] [address] [place] [address] Phone: [phone] Subjective:    I'm seeing this patient by the request  of:  Beam, Lamar POUR, MD  CC: Left shoulder pain  YEP:[id number]  Dana [name] is a 58 y.o. female coming in with complaint of L shoulder pain. Last seen in December 2024 for R shoulder pain. Patient states pain started 6 weeks ago. Thought she slept funny. Trigger point ball, stretching dont help . Pain radiates down her arm and back. Pain at rest but not during activity . ROM WNL. Pain is constant. No numbness or tingling. Ibu, meloxicam , and lidocaine  patches help       Past Medical History:  Diagnosis Date   Appendicitis 01/2022   Cervical stenosis (uterine cervix)    Constipation    Family history of adverse reaction to anesthesia    mother--- ponv   Hematometra    uterine cervix   History of herpes genitalis    History of hypertension    per pt no medication for bp since 2006 , stated lost wt, followed by pcp   Wears contact lenses    Past Surgical History:  Procedure Laterality Date   CESAREAN SECTION     x2 ----  2003  and 02-17-2005 @ [facility]   HYSTEROSCOPY N/A 04/07/2022   Procedure: CERVICAL DIALATION;  Surgeon: Marget Lenis, MD;  Location: [facility];  Service: Gynecology;  Laterality: N/A;   HYSTEROSCOPY W/ ENDOMETRIAL ABLATION  12/15/2009   benign polyp, Dr. Marget   LAPAROSCOPIC APPENDECTOMY N/A 04/07/2022   Procedure: APPENDECTOMY LAPAROSCOPIC;  Surgeon: Lyndel Deward PARAS, MD;  Location: [facility];  Service: General;  Laterality: N/A;   TOE SURGERY     removal bone spur   Social History   Socioeconomic History   Marital status: Married    Spouse name: Not on file   Number of children: Not on file   Years of education: Not on file   Highest education level: Not on file  Occupational History   Not on file  Tobacco Use   Smoking status: Never   Smokeless tobacco: Never   Vaping Use   Vaping status: Never Used  Substance and Sexual Activity   Alcohol use: Yes    Alcohol/week: 2.0 standard drinks of alcohol    Types: 2 drink(s) per week   Drug use: Never   Sexual activity: Not on file    Comment: per pt no menses since ablation in 2011  Other Topics Concern   Not on file  Social History Narrative   Not on file   Social Drivers of Health   Financial Resource Strain: Low Risk  (06/26/2023)   Received from [facility]   Overall Financial Resource Strain (CARDIA)    Difficulty of Paying Living Expenses: Not very hard  Food Insecurity: No Food Insecurity (06/26/2023)   Received from [facility]   Hunger Vital Sign    Within the past 12 months, you worried that your food would run out before you got the money to buy more.: Never true    Within the past 12 months, the food you bought just didn't last and you didn't have money to get more.: Never true  Transportation Needs: No Transportation Needs (06/26/2023)   Received from [facility] - Transportation    Lack of Transportation (Medical): No    Lack of Transportation (Non-Medical): No  Physical Activity: Sufficiently Active (06/26/2023)  Received from [facility]   Exercise Vital Sign    On average, how many days per week do you engage in moderate to strenuous exercise (like a brisk walk)?: 5 days    On average, how many minutes do you engage in exercise at this level?: 60 min  Stress: No Stress Concern Present (06/26/2023)   Received from [facility] - Occupational Stress Questionnaire    Feeling of Stress : Only a little  Social Connections: Socially Integrated (06/26/2023)   Received from [facility]   Social Network    How would you rate your social network (family, work, friends)?: Good participation with social networks   No Known Allergies Family History  Problem Relation Age of Onset   Hyperlipidemia Other    Hypertension Other      Current Outpatient Medications (Endocrine & Metabolic):    predniSONE (DELTASONE) 20 MG tablet, Take 1 tablet (20 mg total) by mouth daily for 5 days.   estradiol  (ESTRACE ) 2 MG tablet, Take 1 tablet (2 mg total) by mouth daily.   progesterone  (PROMETRIUM ) 100 MG capsule, Take 1 capsule (100 mg total) by mouth at bedtime.    Current Outpatient Medications (Analgesics):    meloxicam  (MOBIC ) 15 MG tablet, Take 1 tablet (15 mg total) by mouth daily.   Current Outpatient Medications (Other):    gabapentin  (NEURONTIN ) 100 MG capsule, Take 2 capsules (200 mg total) by mouth at bedtime.   Azelaic Acid  15 % gel, Apply thin coat to affected 2 (two) times daily.   doxycycline  (VIBRAMYCIN ) 100 MG capsule, Take 1 capsule (100 mg total) by mouth daily. Take with food, sun protection.   Omega-3 Fatty Acids (FISH OIL PO), Take 1-2 capsules by mouth daily after breakfast.   tretinoin  (RETIN-A ) 0.025 % cream, APPLY 1 A SMALL AMOUNT TO SKIN EVERY NIGHT   Reviewed prior external information including notes and imaging from  primary care provider As well as notes that were available from care everywhere and other healthcare systems.  Past medical history, social, surgical and family history all reviewed in electronic medical record.  No pertanent information unless stated regarding to the chief complaint.   Review of Systems:  No headache, visual changes, nausea, vomiting, diarrhea, constipation, dizziness, abdominal pain, skin rash, fevers, chills, night sweats, weight loss, swollen lymph nodes, body aches, joint swelling, chest pain, shortness of breath, mood changes. POSITIVE muscle aches  Objective  Blood pressure 122/76, pulse 72, height 5' 3 (1.6 m), weight 194 lb (88 kg), SpO2 100%.   General: No apparent distress alert and oriented x3 mood and affect normal, dressed appropriately.  HEENT: Pupils equal, extraocular movements intact  Respiratory: Patient's speak in full sentences and does  not appear short of breath  Cardiovascular: No lower extremity edema, non tender, no erythema  Left shoulder exam shows good range of motion.  5 out of 5 strength of the shoulder noted.  Neck exam does have a positive Spurling's noted.  Radicular symptoms in the C5 distribution.  Limited muscular skeletal ultrasound was performed and interpreted by CLAUDENE HUSSAR, M   Ultrasound shows no abnormality noted of the rotator cuff.  No true tearing appreciated and no hypoechoic changes consistent with any type of swelling.    Impression and Recommendations:    The above documentation has been reviewed and is accurate and complete Jatavious Peppard M Tashonda Pinkus, DO

## 2024-04-11 ENCOUNTER — Encounter: Payer: Self-pay | Admitting: Family Medicine

## 2024-04-11 ENCOUNTER — Ambulatory Visit: Admitting: Family Medicine

## 2024-04-11 ENCOUNTER — Other Ambulatory Visit (HOSPITAL_BASED_OUTPATIENT_CLINIC_OR_DEPARTMENT_OTHER): Payer: Self-pay

## 2024-04-11 ENCOUNTER — Ambulatory Visit

## 2024-04-11 ENCOUNTER — Other Ambulatory Visit: Payer: Self-pay

## 2024-04-11 VITALS — BP 122/76 | HR 72 | Ht 63.0 in | Wt 194.0 lb

## 2024-04-11 DIAGNOSIS — M5010 Cervical disc disorder with radiculopathy, unspecified cervical region: Secondary | ICD-10-CM

## 2024-04-11 DIAGNOSIS — M25512 Pain in left shoulder: Secondary | ICD-10-CM | POA: Diagnosis not present

## 2024-04-11 MED ORDER — GABAPENTIN 100 MG PO CAPS
200.0000 mg | ORAL_CAPSULE | Freq: Every day | ORAL | 0 refills | Status: AC
  Filled 2024-04-11: qty 180, 90d supply, fill #0

## 2024-04-11 MED ORDER — PREDNISONE 20 MG PO TABS
20.0000 mg | ORAL_TABLET | Freq: Every day | ORAL | 0 refills | Status: AC
  Filled 2024-04-11: qty 5, 5d supply, fill #0

## 2024-04-11 NOTE — Assessment & Plan Note (Signed)
 Likely nerve impingement noted.  Discussed icing regimen and home exercises.  Discussed with patient with radicular symptoms in the C7 distribution.  Likely no significant weakness.  Prednisone 40 mg daily for 5 days gabapentin  200 mg given, discussed avoiding excessive extension of the neck.  Discussed proper sleeping position.  Will start with home exercises for scapular strengthening.  Worsening pain consider the possibility of which activities to do in which ones to avoid.  Follow-up again in 6 to 8 weeks.  Any weakness will need to consider advanced imaging.  X-rays pending

## 2024-04-11 NOTE — Patient Instructions (Addendum)
 Prednsione 40 Gabapentin  200 at night  Scap HEP  Neck xrays  Keep appointment in August

## 2024-04-16 ENCOUNTER — Other Ambulatory Visit (HOSPITAL_BASED_OUTPATIENT_CLINIC_OR_DEPARTMENT_OTHER): Payer: Self-pay

## 2024-04-21 ENCOUNTER — Ambulatory Visit: Payer: Self-pay | Admitting: Family Medicine

## 2024-04-29 ENCOUNTER — Other Ambulatory Visit (HOSPITAL_BASED_OUTPATIENT_CLINIC_OR_DEPARTMENT_OTHER): Payer: Self-pay

## 2024-05-21 NOTE — Progress Notes (Unsigned)
 [name] [address] [place] [address] Phone: [phone] Subjective:   Dana [name], am serving as a scribe for Dr. Arthea Claudene.  I'm seeing this patient by the request  of:  Beam, Lamar POUR, MD  CC: left shoulder pain   YEP:Dlagzrupcz  04/11/2024 Likely nerve impingement noted. Discussed icing regimen and home exercises. Discussed with patient with radicular symptoms in the C7 distribution. Likely no significant weakness. Prednisone  40 mg daily for 5 days gabapentin  200 mg given, discussed avoiding excessive extension of the neck. Discussed proper sleeping position. Will start with home exercises for scapular strengthening. Worsening pain consider the possibility of which activities to do in which ones to avoid. Follow-up again in 6 to 8 weeks. Any weakness will need to consider advanced imaging. X-rays pending   Updated 05/22/2024 Dana [name] is a 58 y.o. female coming in with complaint of L shoulder pain. Doing well. Only has pain when sneezing. Has stopped taking gabapentin . No new symptoms.       Past Medical History:  Diagnosis Date   Appendicitis 01/2022   Cervical stenosis (uterine cervix)    Constipation    Family history of adverse reaction to anesthesia    mother--- ponv   Hematometra    uterine cervix   History of herpes genitalis    History of hypertension    per pt no medication for bp since 2006 , stated lost wt, followed by pcp   Wears contact lenses    Past Surgical History:  Procedure Laterality Date   CESAREAN SECTION     x2 ----  2003  and 02-17-2005 @ [facility]   HYSTEROSCOPY N/A 04/07/2022   Procedure: CERVICAL DIALATION;  Surgeon: Marget Lenis, MD;  Location: [facility];  Service: Gynecology;  Laterality: N/A;   HYSTEROSCOPY W/ ENDOMETRIAL ABLATION  12/15/2009   benign polyp, Dr. Marget   LAPAROSCOPIC APPENDECTOMY N/A 04/07/2022   Procedure: APPENDECTOMY LAPAROSCOPIC;  Surgeon: Lyndel Deward PARAS,  MD;  Location: [facility];  Service: General;  Laterality: N/A;   TOE SURGERY     removal bone spur   Social History   Socioeconomic History   Marital status: Married    Spouse name: Not on file   Number of children: Not on file   Years of education: Not on file   Highest education level: Not on file  Occupational History   Not on file  Tobacco Use   Smoking status: Never   Smokeless tobacco: Never  Vaping Use   Vaping status: Never Used  Substance and Sexual Activity   Alcohol use: Yes    Alcohol/week: 2.0 standard drinks of alcohol    Types: 2 drink(s) per week   Drug use: Never   Sexual activity: Not on file    Comment: per pt no menses since ablation in 2011  Other Topics Concern   Not on file  Social History Narrative   Not on file   Social Drivers of Health   Financial Resource Strain: Low Risk  (06/26/2023)   Received from [facility]   Overall Financial Resource Strain (CARDIA)    Difficulty of Paying Living Expenses: Not very hard  Food Insecurity: No Food Insecurity (06/26/2023)   Received from [facility]   Hunger Vital Sign    Within the past 12 months, you worried that your food would run out before you got the money to buy more.: Never true    Within the past  12 months, the food you bought just didn't last and you didn't have money to get more.: Never true  Transportation Needs: No Transportation Needs (06/26/2023)   Received from [facility] - [facility]    Lack of Transportation (Medical): No    Lack of Transportation (Non-Medical): No  Physical Activity: Sufficiently Active (06/26/2023)   Received from [facility]   Exercise Vital Sign    On average, how many days per week do you engage in moderate to strenuous exercise (like a brisk walk)?: 5 days    On average, how many minutes do you engage in exercise at this level?: 60 min  Stress: No Stress Concern Present (06/26/2023)   Received from [facility] - Occupational Stress Questionnaire    Feeling of Stress : Only a little  Social Connections: Socially Integrated (06/26/2023)   Received from [facility]   Social Network    How would you rate your social network (family, work, friends)?: Good participation with social networks   No Known Allergies Family History  Problem Relation Age of Onset   Hyperlipidemia Other    Hypertension Other     Current Outpatient Medications (Endocrine & Metabolic):    estradiol  (ESTRACE ) 2 MG tablet, Take 1 tablet (2 mg total) by mouth daily.   progesterone  (PROMETRIUM ) 100 MG capsule, Take 1 capsule (100 mg total) by mouth at bedtime.    Current Outpatient Medications (Analgesics):    meloxicam  (MOBIC ) 15 MG tablet, Take 1 tablet (15 mg total) by mouth daily.   Current Outpatient Medications (Other):    Azelaic Acid  15 % gel, Apply thin coat to affected 2 (two) times daily.   doxycycline  (VIBRAMYCIN ) 100 MG capsule, Take 1 capsule (100 mg total) by mouth daily. Take with food, sun protection.   gabapentin  (NEURONTIN ) 100 MG capsule, Take 2 capsules (200 mg total) by mouth at bedtime.   Omega-3 Fatty Acids (FISH OIL PO), Take 1-2 capsules by mouth daily after breakfast.   tretinoin  (RETIN-A ) 0.025 % cream, APPLY 1 A SMALL AMOUNT TO SKIN EVERY NIGHT     Objective  Blood pressure (!) 122/90, pulse 75, height 5' 3 (1.6 m), weight 193 lb (87.5 kg), SpO2 98%.   General: No apparent distress alert and oriented x3 mood and affect normal, dressed appropriately.  HEENT: Pupils equal, extraocular movements intact  Respiratory: Patient's speak in full sentences and does not appear short of breath  Cardiovascular: No lower extremity edema, non tender, no erythema  Patient does have some mild loss of lordosis but significant improvement in range of motion from previous exam.  Patient does have still some mild radicular symptoms in the C5-C6 distribution with Spurling's but  overall seems to be doing well.  5 out of 5 strength noted.  Deep tendon reflexes intact and symmetric in the upper extremities    Impression and Recommendations:    The above documentation has been reviewed and is accurate and complete Angelyn Osterberg M Devonda Pequignot, DO

## 2024-05-22 ENCOUNTER — Ambulatory Visit: Admitting: Family Medicine

## 2024-05-22 ENCOUNTER — Encounter: Payer: Self-pay | Admitting: Family Medicine

## 2024-05-22 VITALS — BP 122/90 | HR 75 | Ht 63.0 in | Wt 193.0 lb

## 2024-05-22 DIAGNOSIS — M5010 Cervical disc disorder with radiculopathy, unspecified cervical region: Secondary | ICD-10-CM

## 2024-05-22 NOTE — Assessment & Plan Note (Signed)
 Significant improvement at this time.  Patient states that she has 99% improved at the moment.  Will discussed with patient about which activities to do in which ones to avoid.  Follow-up with me again as needed

## 2024-05-29 ENCOUNTER — Encounter: Payer: Self-pay | Admitting: Family Medicine

## 2024-06-29 ENCOUNTER — Other Ambulatory Visit (HOSPITAL_BASED_OUTPATIENT_CLINIC_OR_DEPARTMENT_OTHER): Payer: Self-pay

## 2024-07-26 ENCOUNTER — Other Ambulatory Visit (HOSPITAL_BASED_OUTPATIENT_CLINIC_OR_DEPARTMENT_OTHER): Payer: Self-pay

## 2024-07-26 MED ORDER — FLUZONE 0.5 ML IM SUSY
0.5000 mL | PREFILLED_SYRINGE | Freq: Once | INTRAMUSCULAR | 0 refills | Status: AC
Start: 2024-07-26 — End: 2024-07-27
  Filled 2024-07-26: qty 0.5, 1d supply, fill #0

## 2024-08-02 ENCOUNTER — Other Ambulatory Visit (HOSPITAL_BASED_OUTPATIENT_CLINIC_OR_DEPARTMENT_OTHER): Payer: Self-pay

## 2024-08-02 MED ORDER — PROGESTERONE MICRONIZED 100 MG PO CAPS
100.0000 mg | ORAL_CAPSULE | Freq: Every evening | ORAL | 4 refills | Status: AC
  Filled 2024-08-02: qty 90, 90d supply, fill #0
  Filled 2024-10-29: qty 90, 90d supply, fill #1

## 2024-08-02 MED ORDER — VALACYCLOVIR HCL 500 MG PO TABS
500.0000 mg | ORAL_TABLET | Freq: Every day | ORAL | 3 refills | Status: AC
  Filled 2024-08-02: qty 90, 90d supply, fill #0

## 2024-08-02 MED ORDER — ESTRADIOL 2 MG PO TABS
2.0000 mg | ORAL_TABLET | Freq: Every day | ORAL | 4 refills | Status: AC
  Filled 2024-08-02: qty 90, 90d supply, fill #0
  Filled 2024-10-29: qty 90, 90d supply, fill #1

## 2024-08-08 ENCOUNTER — Other Ambulatory Visit: Payer: Self-pay | Admitting: Obstetrics and Gynecology

## 2024-08-08 DIAGNOSIS — R928 Other abnormal and inconclusive findings on diagnostic imaging of breast: Secondary | ICD-10-CM

## 2024-08-19 ENCOUNTER — Other Ambulatory Visit (HOSPITAL_BASED_OUTPATIENT_CLINIC_OR_DEPARTMENT_OTHER): Payer: Self-pay

## 2024-08-19 ENCOUNTER — Other Ambulatory Visit: Payer: Self-pay | Admitting: Family Medicine

## 2024-08-19 MED ORDER — MELOXICAM 15 MG PO TABS
15.0000 mg | ORAL_TABLET | Freq: Every day | ORAL | 0 refills | Status: AC
  Filled 2024-08-19: qty 90, 90d supply, fill #0

## 2024-08-20 ENCOUNTER — Ambulatory Visit
Admission: RE | Admit: 2024-08-20 | Discharge: 2024-08-20 | Disposition: A | Discharge status: Home / Self Care | Source: Ambulatory Visit | Attending: Obstetrics and Gynecology | Admitting: Obstetrics and Gynecology

## 2024-08-20 ENCOUNTER — Ambulatory Visit

## 2024-08-20 DIAGNOSIS — R928 Other abnormal and inconclusive findings on diagnostic imaging of breast: Secondary | ICD-10-CM

## 2024-10-25 ENCOUNTER — Other Ambulatory Visit (HOSPITAL_BASED_OUTPATIENT_CLINIC_OR_DEPARTMENT_OTHER): Payer: Self-pay

## 2024-10-25 ENCOUNTER — Encounter

## 2024-10-25 MED ORDER — AMOXICILLIN-POT CLAVULANATE 875-125 MG PO TABS
1.0000 | ORAL_TABLET | Freq: Two times a day (BID) | ORAL | 0 refills | Status: AC
  Filled 2024-10-25: qty 14, 7d supply, fill #0

## 2024-10-29 ENCOUNTER — Other Ambulatory Visit (HOSPITAL_BASED_OUTPATIENT_CLINIC_OR_DEPARTMENT_OTHER): Payer: Self-pay

## 2024-10-31 ENCOUNTER — Other Ambulatory Visit (HOSPITAL_BASED_OUTPATIENT_CLINIC_OR_DEPARTMENT_OTHER): Payer: Self-pay
